# Patient Record
Sex: Female | Born: 1985
Health system: Southern US, Community
[De-identification: ages and names within clinical notes are randomized; demographics above are authoritative.]

## PROBLEM LIST (undated history)

## (undated) DIAGNOSIS — J45909 Unspecified asthma, uncomplicated: Secondary | ICD-10-CM

---

## 2016-08-12 ENCOUNTER — Emergency Department (HOSPITAL_COMMUNITY): Payer: Self-pay

## 2016-08-12 ENCOUNTER — Encounter (HOSPITAL_COMMUNITY): Payer: Self-pay | Admitting: Emergency Medicine

## 2016-08-12 ENCOUNTER — Emergency Department (HOSPITAL_COMMUNITY)
Admission: EM | Admit: 2016-08-12 | Discharge: 2016-08-12 | Disposition: A | Discharge status: Home / Self Care | Payer: Self-pay | Attending: Emergency Medicine | Admitting: Emergency Medicine

## 2016-08-12 DIAGNOSIS — J45909 Unspecified asthma, uncomplicated: Secondary | ICD-10-CM | POA: Insufficient documentation

## 2016-08-12 DIAGNOSIS — K0889 Other specified disorders of teeth and supporting structures: Secondary | ICD-10-CM

## 2016-08-12 DIAGNOSIS — R0789 Other chest pain: Secondary | ICD-10-CM

## 2016-08-12 HISTORY — DX: Unspecified asthma, uncomplicated: J45.909

## 2016-08-12 LAB — BASIC METABOLIC PANEL
Anion gap: 7 (ref 5–15)
BUN: 13 mg/dL (ref 6–20)
CALCIUM: 9.6 mg/dL (ref 8.9–10.3)
CO2: 24 mmol/L (ref 22–32)
CREATININE: 0.58 mg/dL (ref 0.44–1.00)
Chloride: 106 mmol/L (ref 101–111)
GFR calc Af Amer: >60 mL/min (ref >60)
GLUCOSE: 105 mg/dL — AB (ref 65–99)
Potassium: 4.3 mmol/L (ref 3.5–5.1)
SODIUM: 137 mmol/L (ref 135–145)

## 2016-08-12 LAB — CBC
HEMATOCRIT: 38 % (ref 36.0–46.0)
Hemoglobin: 12.9 g/dL (ref 12.0–15.0)
MCH: 27.5 pg (ref 26.0–34.0)
MCHC: 33.9 g/dL (ref 30.0–36.0)
MCV: 81 fL (ref 78.0–100.0)
PLATELETS: 269 10*3/uL (ref 150–400)
RBC: 4.69 MIL/uL (ref 3.87–5.11)
RDW: 13.5 % (ref 11.5–15.5)
WBC: 7.2 10*3/uL (ref 4.0–10.5)

## 2016-08-12 LAB — I-STAT TROPONIN, ED
TROPONIN I, POC: 0 ng/mL (ref 0.00–0.08)
Troponin i, poc: 0 ng/mL (ref 0.00–0.08)

## 2016-08-12 MED ORDER — AMOXICILLIN-POT CLAVULANATE 875-125 MG PO TABS: 1.0000 | ORAL_TABLET | Freq: Two times a day (BID) | ORAL | 0 refills | Status: AC

## 2016-08-12 MED ORDER — IBUPROFEN 800 MG PO TABS: 800.0000 mg | ORAL_TABLET | Freq: Three times a day (TID) | ORAL | 0 refills | Status: DC | PRN

## 2016-08-12 NOTE — ED Provider Notes (Signed)
[facility]-EMERGENCY DEPT Provider Note   CSN: [id number] Arrival date & time: 08/12/16  1352     History   Chief Complaint Chief Complaint  Patient presents with  . Chest Pain  . Dental Pain    HPI Nichole Morris is a 31 y.o. female . Remote history of resolved asthma presenting to the emergency department with 1 day of sharp left upper chest and shoulder discomfort. She states that she has been getting those types of chest pains in the past and has seen a physician but everything was normal. She felt like the pain was sharper and more intense since yesterday and her sister told her to go to the emergency department. She describes the pain as throbbing, waxing and waning with no change in intensity between episodes. She just moved here from [place] and is under a lot of stress. She also reports throbbing dental pain of upper left molars with facial tenderness and puffiness on her left cheek which is worse at night. She states that she does not have insurance at this time he was not able to see a dentist and would like to get resources for dentists in the area. She has tried ibuprofen with modest relief and rinsing her mouth with Peroxide and salt water which has not been helping. 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 She denies any fever, chills, nausea, vomiting, shortness of breath, syncope, or any other symptoms.  HPI  Past Medical History:  Diagnosis Date  . Asthma     There are no active problems to display for this patient.   Past Surgical History:  Procedure Laterality Date  . CESAREAN SECTION      OB History    Gravida Para Term Preterm AB Living   1             SAB TAB Ectopic Multiple Live Births                   Home Medications    Prior to Admission medications   Medication Sig Start Date End Date Taking? Authorizing Provider  ibuprofen (ADVIL,MOTRIN) 200 MG tablet Take 400 mg by mouth every 6 (six) hours as needed for  headache or moderate pain.   Yes Historical Provider, MD  amoxicillin-clavulanate (AUGMENTIN) 875-125 MG tablet Take 1 tablet by mouth 2 (two) times daily. 08/12/16 08/19/16  Georgiana Shore, PA-C  ibuprofen (ADVIL,MOTRIN) 800 MG tablet Take 1 tablet (800 mg total) by mouth every 8 (eight) hours as needed for moderate pain. 08/12/16   Georgiana Shore, PA-C    Family History History reviewed. No pertinent family history.  Social History Social History  Substance Use Topics  . Smoking status: Never Smoker  . Smokeless tobacco: Never Used  . Alcohol use Not on file     Allergies   Shellfish allergy and Iodine   Review of Systems Review of Systems  Constitutional: Negative for appetite change, chills and fever.  HENT: Positive for dental problem and facial swelling. Negative for congestion, drooling, ear pain, sore throat and trouble swallowing.   Eyes: Negative for pain and visual disturbance.  Respiratory: Negative for cough, choking, chest tightness, shortness of breath, wheezing and stridor.   Cardiovascular: Negative for leg swelling.       Patient with left upper chest waxing and waning throbbing pain  Gastrointestinal: Negative for abdominal distention, abdominal pain, diarrhea, nausea and vomiting.  Genitourinary: Negative for dysuria and hematuria.  Musculoskeletal: Negative for arthralgias, back pain, myalgias, neck pain and neck stiffness.       Patient describes some throbbing in her left shoulder  Skin: Negative for color change, pallor, rash and wound.  Neurological: Negative for seizures, syncope, weakness, light-headedness and numbness.  All other systems reviewed and are negative.    Physical Exam Updated Vital Signs BP 119/64 (BP Location: Left Arm)   Pulse 71   Temp 98.4 F (36.9 C) (Oral)   Resp 17   Ht 5\' 1"  (1.549 m)   Wt 86.2 kg   LMP 07/12/2016   SpO2 100%   Breastfeeding? Unknown   BMI 35.90 kg/m   Physical Exam  Constitutional: She appears  well-developed and well-nourished. No distress.  Afebrile, nontoxic appearing, sitting comfortably in bed in no acute distress.  HENT:  Head: Normocephalic and atraumatic.  Mouth/Throat: Oropharynx is clear and moist. No oropharyngeal exudate.  No evidence of oral abscess on exam. Significant decay of the left upper molars. Tooth pain when touched. Oral mucosa is soft and sublingual mucosa is soft without evidence for lugwigs angina  Eyes: Conjunctivae and EOM are normal. Right eye exhibits no discharge. Left eye exhibits no discharge. No scleral icterus.  Neck: Normal range of motion. Neck supple.  Cardiovascular: Normal rate and regular rhythm.   No murmur heard. Pulmonary/Chest: Effort normal and breath sounds normal. No respiratory distress. She has no wheezes. She exhibits tenderness.  Patient was tender to palpation of the left upper chest wall and clavicle. Especially between the second and third intercostal  Abdominal: Soft. She exhibits no distension. There is no tenderness.  Musculoskeletal: Normal range of motion. She exhibits no edema.  She had full active range of motion of left shoulder without pain. No pain to palpation of bony landmarks of shoulder or left arm.  Neurological: She is alert. No sensory deficit.  Skin: Skin is warm and dry. No rash noted. She is not diaphoretic. No erythema. There is pallor.  Psychiatric: She has a normal mood and affect. Her behavior is normal.  Nursing note and vitals reviewed.    ED Treatments / Results  Labs (all labs ordered are listed, but only abnormal results are displayed) Labs Reviewed  BASIC METABOLIC PANEL - Abnormal; Notable for the following:       Result Value   Glucose, Bld 105 (*)    All other components within normal limits  CBC  I-STAT TROPOININ, ED  Rosezena SensorI-STAT TROPOININ, ED  Rosezena SensorI-STAT TROPOININ, ED    EKG  EKG Interpretation  Date/Time:  Wednesday August 12 2016 14:03:51 EST Ventricular Rate:  75 PR Interval:     QRS Duration: 90 QT Interval:  369 QTC Calculation: 413 R Axis:   63 Text Interpretation:  Sinus rhythm Baseline wander in lead(s) III nrmal. no old comparison. Confirmed by Donnald GarrePfeiffer, MD, Lebron ConnersMarcy [phone]([id number]) on 08/12/2016 4:30:56 PM       Radiology Dg Chest 2 View  Result Date: 08/12/2016 CLINICAL DATA:  Chest pain radiating to left arm EXAM: CHEST  2 VIEW COMPARISON:  None. FINDINGS: Lungs are clear. Heart size and pulmonary vascularity are normal. No adenopathy. No pneumothorax. No bone lesions. IMPRESSION: No edema or consolidation. Electronically Signed   By: Bretta BangWilliam  Woodruff III M.D.   On: 08/12/2016 15:02    Procedures Procedures (including critical care time)  Medications Ordered in ED Medications - No data to display   Initial Impression / Assessment and Plan / ED Course  I have reviewed the triage  vital signs and the nursing notes.  Pertinent labs & imaging results that were available during my care of the patient were reviewed by me and considered in my medical decision making (see chart for details).  Clinical Course    Patient is a 31 year old female, afebrile, non-toxic appearing, sitting comfortably in bed. No acute distress. Reassuring exam and workup. Negative troponin, negative chest x-ray and normal EKG. She is currently moving from [place] and under stress. She states that she is feeling well but due to her family history, her sister insisted that she come in and get checked.   16:50 As she was ready to go home she mentioned that her father had an MI at 53 and mother had MI in her 62s. Spoke to Dr Donnald Garre regarding this patient and recommended second troponin. Ordered second troponin.  Second troponin drawn 18:14 negative  HEART score: 1 Her BMI is > 30 and she has a family history of early cardiac disease. Based on patient's presentation, lab workup and risk factors, It is less likely to be of cardiac origin. 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 She is tender to palpation and pain is waxing and waning.   Discharge home with antibiotic, ibuprofen, close follow up with dentist and PCP. Provided patient with resources in the area for dental and PCP. Urged patient to follow-up with dentist.  Exam unconcerning for Ludwig's angina or spread of infection.    Discussed strict return precautions. Patient was advised to return to the emergency department if experiencing any worsening of symptoms, including increased facial; swelling, fever, chills, shortness of breath, chest tightness, nausea, vomiting, weakness or any other concerning symptoms. She understood instructions and agreed with discharge plan. Patient was discussed with Dr. Donnald Garre who agrees with assessment and plan.  Final Clinical Impressions(s) / ED Diagnoses   Final diagnoses:  Pain, dental  Chest wall pain    New Prescriptions New Prescriptions   AMOXICILLIN-CLAVULANATE (AUGMENTIN) 875-125 MG TABLET    Take 1 tablet by mouth 2 (two) times daily.   IBUPROFEN (ADVIL,MOTRIN) 800 MG TABLET    Take 1 tablet (800 mg total) by mouth every 8 (eight) hours as needed for moderate pain.     Georgiana Shore, PA-C 08/12/16 1904    Georgiana Shore, PA-C 08/13/16 0303    Arby Barrette, MD 08/13/16 [phone]

## 2016-08-12 NOTE — ED Notes (Signed)
Per PA, redraw Troponin at 1752.

## 2016-08-12 NOTE — ED Triage Notes (Signed)
Patient reports intermittent sharp left sided chest pain radiating to the left arm. Denies SOB, N/V. Patient also reports left upper molar pain x2 days. Patient states tooth has been cracked for a while.

## 2017-08-05 ENCOUNTER — Encounter (HOSPITAL_COMMUNITY): Payer: Self-pay

## 2017-08-05 ENCOUNTER — Emergency Department (HOSPITAL_COMMUNITY)
Admission: EM | Admit: 2017-08-05 | Discharge: 2017-08-05 | Disposition: A | Discharge status: Home / Self Care | Payer: Self-pay | Attending: Emergency Medicine | Admitting: Emergency Medicine

## 2017-08-05 ENCOUNTER — Other Ambulatory Visit: Payer: Self-pay

## 2017-08-05 ENCOUNTER — Emergency Department (HOSPITAL_COMMUNITY): Payer: Self-pay

## 2017-08-05 DIAGNOSIS — J181 Lobar pneumonia, unspecified organism: Secondary | ICD-10-CM

## 2017-08-05 DIAGNOSIS — J45909 Unspecified asthma, uncomplicated: Secondary | ICD-10-CM | POA: Insufficient documentation

## 2017-08-05 DIAGNOSIS — J189 Pneumonia, unspecified organism: Secondary | ICD-10-CM | POA: Insufficient documentation

## 2017-08-05 LAB — CBC WITH DIFFERENTIAL/PLATELET
BASOS ABS: 0 10*3/uL (ref 0.0–0.1)
BASOS PCT: 0 %
EOS ABS: 0.1 10*3/uL (ref 0.0–0.7)
EOS PCT: 2 %
HCT: 38 % (ref 36.0–46.0)
Hemoglobin: 12.8 g/dL (ref 12.0–15.0)
Lymphocytes Relative: 29 %
Lymphs Abs: 1.7 10*3/uL (ref 0.7–4.0)
MCH: 27.3 pg (ref 26.0–34.0)
MCHC: 33.7 g/dL (ref 30.0–36.0)
MCV: 81 fL (ref 78.0–100.0)
MONO ABS: 0.2 10*3/uL (ref 0.1–1.0)
Monocytes Relative: 4 %
Neutro Abs: 3.8 10*3/uL (ref 1.7–7.7)
Neutrophils Relative %: 65 %
PLATELETS: 234 10*3/uL (ref 150–400)
RBC: 4.69 MIL/uL (ref 3.87–5.11)
RDW: 13.6 % (ref 11.5–15.5)
WBC: 5.9 10*3/uL (ref 4.0–10.5)

## 2017-08-05 LAB — I-STAT BETA HCG BLOOD, ED (MC, WL, AP ONLY)

## 2017-08-05 LAB — COMPREHENSIVE METABOLIC PANEL
ALBUMIN: 3.8 g/dL (ref 3.5–5.0)
ALK PHOS: 119 U/L (ref 38–126)
ALT: 17 U/L (ref 14–54)
AST: 32 U/L (ref 15–41)
Anion gap: 11 (ref 5–15)
BILIRUBIN TOTAL: 0.3 mg/dL (ref 0.3–1.2)
BUN: 9 mg/dL (ref 6–20)
CALCIUM: 9.3 mg/dL (ref 8.9–10.3)
CO2: 20 mmol/L — ABNORMAL LOW (ref 22–32)
CREATININE: 0.75 mg/dL (ref 0.44–1.00)
Chloride: 108 mmol/L (ref 101–111)
GFR calc Af Amer: >60 mL/min (ref >60)
Glucose, Bld: 136 mg/dL — ABNORMAL HIGH (ref 65–99)
POTASSIUM: 3.6 mmol/L (ref 3.5–5.1)
Sodium: 139 mmol/L (ref 135–145)
TOTAL PROTEIN: 7 g/dL (ref 6.5–8.1)

## 2017-08-05 MED ORDER — AZITHROMYCIN 250 MG PO TABS: ORAL_TABLET | ORAL | 0 refills | Status: DC

## 2017-08-05 NOTE — ED Provider Notes (Signed)
[facility]   CSN: [id number] Arrival date & time: 08/05/17  1050     History   Chief Complaint Chief Complaint  Patient presents with  . Asthma  . Shortness of Breath    HPI Nichole Morris is a 31 y.o. female.  She is a been ill for 1 week with cough, shortness of breath, sputum production, and general achiness.  Is using her nebulizer at home without relief.  She is felt warm but not taken her temperature.  She does not smoke cigarettes or take oral contraceptives.  No known sick contacts.  There are no other known modifying factors.  HPI  Past Medical History:  Diagnosis Date  . Asthma     There are no active problems to display for this patient.   Past Surgical History:  Procedure Laterality Date  . CESAREAN SECTION      OB History    Gravida Para Term Preterm AB Living   1             SAB TAB Ectopic Multiple Live Births                   Home Medications    Prior to Admission medications   Medication Sig Start Date End Date Taking? Authorizing Provider  azithromycin (ZITHROMAX Z-PAK) 250 MG tablet 2 po day one, then 1 daily x 4 days 08/05/17   Mancel BaleWentz, Naasir Carreira, MD  ibuprofen (ADVIL,MOTRIN) 200 MG tablet Take 400 mg by mouth every 6 (six) hours as needed for headache or moderate pain.    [provider]  ibuprofen (ADVIL,MOTRIN) 800 MG tablet Take 1 tablet (800 mg total) by mouth every 8 (eight) hours as needed for moderate pain. Patient not taking: Reported on 08/05/2017 08/12/16   Gregary CromerMitchell, Jessica B, PA-C    Family History History reviewed. No pertinent family history.  Social History Social History   Tobacco Use  . Smoking status: Never Smoker  . Smokeless tobacco: Never Used  Substance Use Topics  . Alcohol use: No    Frequency: Never  . Drug use: Not on file     Allergies   Shellfish allergy and Iodine   Review of Systems Review of Systems  All other systems  reviewed and are negative.    Physical Exam Updated Vital Signs BP 111/62   Pulse 70   Temp 98.1 F (36.7 C) (Oral)   Resp (!) 23   LMP 07/31/2017 (Exact Date)   SpO2 98%   Physical Exam  Constitutional: She is oriented to person, place, and time. She appears well-developed and well-nourished. She does not appear ill.  HENT:  Head: Normocephalic and atraumatic.  Eyes: Conjunctivae and EOM are normal. Pupils are equal, round, and reactive to light.  Neck: Normal range of motion and phonation normal. Neck supple.  Cardiovascular: Normal rate and regular rhythm.  Pulmonary/Chest: Effort normal and breath sounds normal. No accessory muscle usage or stridor. Tachypnea noted. No apnea and no bradypnea. No respiratory distress. She has no decreased breath sounds. She has no wheezes. She has no rhonchi. She has no rales. She exhibits no tenderness.  Abdominal: Soft. She exhibits no distension. There is no tenderness. There is no guarding.  Musculoskeletal: Normal range of motion.  Neurological: She is alert and oriented to person, place, and time. She exhibits normal muscle tone.  Skin: Skin is warm and dry.  Psychiatric: She has a normal mood and affect. Her behavior is  normal. Judgment and thought content normal.  Nursing note and vitals reviewed.    ED Treatments / Results  Labs (all labs ordered are listed, but only abnormal results are displayed) Labs Reviewed  COMPREHENSIVE METABOLIC PANEL - Abnormal; Notable for the following components:      Result Value   CO2 20 (*)    Glucose, Bld 136 (*)    All other components within normal limits  CBC WITH DIFFERENTIAL/PLATELET  I-STAT BETA HCG BLOOD, ED ([facility], [facility], [facility] ONLY)    EKG  EKG Interpretation None       Radiology Dg Chest 2 View  Result Date: 08/05/2017 CLINICAL DATA:  Cough over the last 3 weeks, worsening recently. EXAM: CHEST  2 VIEW COMPARISON:  08/12/2016 FINDINGS: Heart size is normal. Mediastinal shadows are  normal. There is a bronchitis pattern. Question mild patchy pneumonia in the right upper lobe. No dense consolidation or lobar collapse. No effusions. IMPRESSION: Bronchitis. Question patchy pneumonia right upper lobe. Electronically Signed   By: Paulina FusiMark  Shogry M.D.   On: 08/05/2017 11:29    Procedures Procedures (including critical care time)  Medications Ordered in ED Medications - No data to display   Initial Impression / Assessment and Plan / ED Course  I have reviewed the triage vital signs and the nursing notes.  Pertinent labs & imaging results that were available during my care of the patient were reviewed by me and considered in my medical decision making (see chart for details).      Patient Vitals for the past 24 hrs:  BP Temp Temp src Pulse Resp SpO2  08/05/17 1200 111/62 - - 70 (!) 23 98 %  08/05/17 1101 122/84 98.1 F (36.7 C) Oral 79 20 100 %    12:59 PM Reevaluation with update and discussion. After initial assessment and treatment, an updated evaluation reveals no change in clinical status.  Findings discussed with the patient and all questions were answered. Mancel BaleElliott Manha Amato     Final Clinical Impressions(s) / ED Diagnoses   Final diagnoses:  Community acquired pneumonia of right upper lobe of lung (HCC)   Evaluations consistent with pneumonia, nonspecified.  Doubt serious bacterial infection, metabolic instability or impending vascular collapse.  Nursing Notes Reviewed/ Care Coordinated Applicable Imaging Reviewed Interpretation of Laboratory Data incorporated into ED treatment  The patient appears reasonably screened and/or stabilized for discharge and I doubt any other medical condition or other [facility] requiring further screening, evaluation, or treatment in the ED at this time prior to discharge.  Plan: Home Medications-continue current medications; Home Treatments-rest, fluids; return here if the recommended treatment, does not improve the symptoms;  Recommended follow up-PCP, PRN   ED Discharge Orders        Ordered    azithromycin (ZITHROMAX Z-PAK) 250 MG tablet     08/05/17 1301       Mancel BaleWentz, Shaquille Janes, MD 08/05/17 1303

## 2017-08-05 NOTE — Discharge Instructions (Signed)
Get plenty of rest, drink a lot of fluids and take the medication as directed.  Use Tylenol, if needed, for fever.  See the doctor of your choice if not better in 3 or 4 days.

## 2017-08-05 NOTE — ED Triage Notes (Signed)
Pt states she has had non productive cough X2 weeks. Pt states she has had increased shortness of breath for 3 days. Pt states using albuterol treatments at home without feeling better. Lung sounds are clear however. Skin warm and dry.

## 2017-08-13 ENCOUNTER — Emergency Department (HOSPITAL_COMMUNITY): Payer: Self-pay

## 2017-08-13 ENCOUNTER — Other Ambulatory Visit: Payer: Self-pay

## 2017-08-13 ENCOUNTER — Emergency Department (HOSPITAL_COMMUNITY)
Admission: EM | Admit: 2017-08-13 | Discharge: 2017-08-13 | Disposition: A | Discharge status: Home / Self Care | Payer: Self-pay | Attending: Emergency Medicine | Admitting: Emergency Medicine

## 2017-08-13 ENCOUNTER — Encounter (HOSPITAL_COMMUNITY): Payer: Self-pay

## 2017-08-13 DIAGNOSIS — R0602 Shortness of breath: Secondary | ICD-10-CM | POA: Insufficient documentation

## 2017-08-13 DIAGNOSIS — J45909 Unspecified asthma, uncomplicated: Secondary | ICD-10-CM | POA: Insufficient documentation

## 2017-08-13 LAB — COMPREHENSIVE METABOLIC PANEL
ALK PHOS: 120 U/L (ref 38–126)
ALT: 22 U/L (ref 14–54)
AST: 23 U/L (ref 15–41)
Albumin: 4.1 g/dL (ref 3.5–5.0)
Anion gap: 7 (ref 5–15)
BUN: 15 mg/dL (ref 6–20)
CALCIUM: 9.6 mg/dL (ref 8.9–10.3)
CO2: 23 mmol/L (ref 22–32)
Chloride: 105 mmol/L (ref 101–111)
Creatinine, Ser: 0.6 mg/dL (ref 0.44–1.00)
Glucose, Bld: 101 mg/dL — ABNORMAL HIGH (ref 65–99)
Potassium: 3.9 mmol/L (ref 3.5–5.1)
SODIUM: 135 mmol/L (ref 135–145)
Total Bilirubin: 0.7 mg/dL (ref 0.3–1.2)
Total Protein: 7.5 g/dL (ref 6.5–8.1)

## 2017-08-13 LAB — I-STAT BETA HCG BLOOD, ED (MC, WL, AP ONLY)

## 2017-08-13 LAB — CBC WITH DIFFERENTIAL/PLATELET
BASOS PCT: 0 %
Basophils Absolute: 0 10*3/uL (ref 0.0–0.1)
EOS ABS: 0.2 10*3/uL (ref 0.0–0.7)
EOS PCT: 2 %
HCT: 38.7 % (ref 36.0–46.0)
HEMOGLOBIN: 12.8 g/dL (ref 12.0–15.0)
LYMPHS ABS: 1.8 10*3/uL (ref 0.7–4.0)
Lymphocytes Relative: 23 %
MCH: 27.2 pg (ref 26.0–34.0)
MCHC: 33.1 g/dL (ref 30.0–36.0)
MCV: 82.2 fL (ref 78.0–100.0)
MONOS PCT: 6 %
Monocytes Absolute: 0.5 10*3/uL (ref 0.1–1.0)
NEUTROS PCT: 69 %
Neutro Abs: 5.4 10*3/uL (ref 1.7–7.7)
PLATELETS: 279 10*3/uL (ref 150–400)
RBC: 4.71 MIL/uL (ref 3.87–5.11)
RDW: 14 % (ref 11.5–15.5)
WBC: 7.9 10*3/uL (ref 4.0–10.5)

## 2017-08-13 LAB — I-STAT CG4 LACTIC ACID, ED
Lactic Acid, Venous: 0.84 mmol/L (ref 0.5–1.9)
Lactic Acid, Venous: 1.07 mmol/L (ref 0.5–1.9)

## 2017-08-13 LAB — TROPONIN I: Troponin I: <0.03 ng/mL (ref <0.03)

## 2017-08-13 MED ORDER — IOPAMIDOL (ISOVUE-370) INJECTION 76%
INTRAVENOUS | Status: AC
  Administered 2017-08-13: 100 mL
  Filled 2017-08-13: qty 100

## 2017-08-13 NOTE — ED Triage Notes (Signed)
Pt states she was seen here last week and dx with pna. She states she still has shortness of breath and has completed her abx. No distress noted. Skin warm and dry.

## 2017-08-13 NOTE — ED Provider Notes (Signed)
[facility]   CSN: [id number] Arrival date & time: 08/13/17  1147     History   Chief Complaint Chief Complaint  Patient presents with  . Pneumonia    HPI Nichole Morris is a 32 y.o. female.  Pt presents to the ED today with sob.  The pt said she was initially seen in the ED on 12/27.  She was diagnosed with pna and put on zithromax.  Pt said she's finished her abx, but is still sob.  She used her neb machine this morning and that did not help.  She is breathing better now.  No fevers.      Past Medical History:  Diagnosis Date  . Asthma     There are no active problems to display for this patient.   Past Surgical History:  Procedure Laterality Date  . CESAREAN SECTION      OB History    Gravida Para Term Preterm AB Living   1             SAB TAB Ectopic Multiple Live Births                   Home Medications    Prior to Admission medications   Medication Sig Start Date End Date Taking? Authorizing Provider  azithromycin (ZITHROMAX Z-PAK) 250 MG tablet 2 po day one, then 1 daily x 4 days 08/05/17   Mancel BaleWentz, Elliott, MD  ibuprofen (ADVIL,MOTRIN) 200 MG tablet Take 400 mg by mouth every 6 (six) hours as needed for headache or moderate pain.    [provider]  ibuprofen (ADVIL,MOTRIN) 800 MG tablet Take 1 tablet (800 mg total) by mouth every 8 (eight) hours as needed for moderate pain. Patient not taking: Reported on 08/05/2017 08/12/16   Gregary CromerMitchell, Jessica B, PA-C    Family History History reviewed. No pertinent family history.  Social History Social History   Tobacco Use  . Smoking status: Never Smoker  . Smokeless tobacco: Never Used  Substance Use Topics  . Alcohol use: No    Frequency: Never  . Drug use: Not on file     Allergies   Shellfish allergy and Iodine   Review of Systems Review of Systems  Respiratory: Positive for shortness of breath.   All other systems reviewed  and are negative.    Physical Exam Updated Vital Signs BP (!) 102/58 (BP Location: Right Arm)   Pulse 71   Temp 98.3 F (36.8 C) (Oral)   Resp 18   LMP 07/31/2017 (Exact Date)   SpO2 100%   Physical Exam  Constitutional: She is oriented to person, place, and time. She appears well-developed and well-nourished.  HENT:  Head: Normocephalic and atraumatic.  Right Ear: External ear normal.  Left Ear: External ear normal.  Nose: Nose normal.  Mouth/Throat: Oropharynx is clear and moist.  Eyes: Conjunctivae and EOM are normal. Pupils are equal, round, and reactive to light.  Neck: Normal range of motion. Neck supple.  Cardiovascular: Normal rate, regular rhythm, normal heart sounds and intact distal pulses.  Pulmonary/Chest: Effort normal and breath sounds normal.  Abdominal: Soft. Bowel sounds are normal.  Musculoskeletal: Normal range of motion.  Neurological: She is alert and oriented to person, place, and time.  Skin: Skin is warm and dry. Capillary refill takes less than 2 seconds.  Psychiatric: She has a normal mood and affect. Her behavior is normal. Judgment and thought content normal.  Nursing note and  vitals reviewed.    ED Treatments / Results  Labs (all labs ordered are listed, but only abnormal results are displayed) Labs Reviewed  COMPREHENSIVE METABOLIC PANEL - Abnormal; Notable for the following components:      Result Value   Glucose, Bld 101 (*)    All other components within normal limits  CBC WITH DIFFERENTIAL/PLATELET  TROPONIN I  I-STAT CG4 LACTIC ACID, ED  I-STAT BETA HCG BLOOD, ED ([facility], [facility], [facility] ONLY)  I-STAT CG4 LACTIC ACID, ED    EKG  EKG Interpretation  Date/Time:  Friday August 13 2017 12:54:01 EST Ventricular Rate:  84 PR Interval:  138 QRS Duration: 80 QT Interval:  358 QTC Calculation: 423 R Axis:   90 Text Interpretation:  Normal sinus rhythm Rightward axis Borderline ECG No significant change since last tracing Confirmed by  Jacalyn Lefevre [phone]) on 08/13/2017 1:42:23 PM       Radiology Dg Chest 2 View  Result Date: 08/13/2017 CLINICAL DATA:  Worsening cough and shortness of breath. EXAM: CHEST  2 VIEW COMPARISON:  08/05/2017 FINDINGS: Cardiomediastinal silhouette is normal. Mediastinal contours appear intact. There is no evidence of focal airspace consolidation, pleural effusion or pneumothorax. Osseous structures are without acute abnormality. Soft tissues are grossly normal. IMPRESSION: No active cardiopulmonary disease. Electronically Signed   By: Ted Mcalpine M.D.   On: 08/13/2017 13:15    Procedures Procedures (including critical care time)  Medications Ordered in ED Medications  iopamidol (ISOVUE-370) 76 % injection (100 mLs  Contrast Given 08/13/17 1552)     Initial Impression / Assessment and Plan / ED Course  I have reviewed the triage vital signs and the nursing notes.  Pertinent labs & imaging results that were available during my care of the patient were reviewed by me and considered in my medical decision making (see chart for details).    Pt signed out to Dr. Effie Shy pending results of CT angio.  Disposition pending results.  Final Clinical Impressions(s) / ED Diagnoses   Final diagnoses:  Shortness of breath    ED Discharge Orders    None       Jacalyn Lefevre, MD 08/13/17 [phone]

## 2017-08-13 NOTE — ED Notes (Signed)
Attempted PIVx 3; no success. Second RN to attempt. 

## 2017-08-13 NOTE — ED Notes (Signed)
Patient transported to CT 

## 2017-08-13 NOTE — Discharge Instructions (Signed)
Follow-up with the doctor of your choice as needed for further problems and treatment.  For nasal congestion, try using nasal saline spray, to moisturize the nasal passages.  For pain, use Tylenol.  For trouble breathing use your albuterol nebulizer.

## 2017-08-13 NOTE — ED Provider Notes (Signed)
Patient seen in follow-up for Dr. Particia NearingHaviland, after CT imaging to evaluate for pulmonary embolus.  She has completed her Zithromax prescribed by me recently for pneumonia.  At this time the patient complains of some nasal congestion, which led to her trouble breathing today.  I advised her to use nasal saline as needed, and follow-up with a primary care doctor of her choice.  She stated that she had a list to go on to find a doctor.  Medical decision making: Nonspecific shortness of breath.  CT does not indicate PE or pneumonia.  Patient is nontoxic, with reassuring evaluation here today.   Patient is recovering from pneumonia, no further treatment needed at this time.  She has a nebulizer at home to use as needed for shortness of breath.   Mancel BaleWentz, Jaquitta Dupriest, MD 08/13/17 [phone]

## 2018-01-18 ENCOUNTER — Ambulatory Visit (HOSPITAL_COMMUNITY)
Admission: EM | Admit: 2018-01-18 | Discharge: 2018-01-18 | Disposition: A | Discharge status: Home / Self Care | Payer: Self-pay

## 2018-01-18 NOTE — ED Notes (Signed)
Left from lobby

## 2018-08-30 ENCOUNTER — Ambulatory Visit (HOSPITAL_COMMUNITY)
Admission: EM | Admit: 2018-08-30 | Discharge: 2018-08-30 | Disposition: A | Discharge status: Home / Self Care | Payer: Self-pay | Attending: Family Medicine | Admitting: Family Medicine

## 2018-08-30 ENCOUNTER — Encounter (HOSPITAL_COMMUNITY): Payer: Self-pay

## 2018-08-30 DIAGNOSIS — H9201 Otalgia, right ear: Secondary | ICD-10-CM

## 2018-08-30 DIAGNOSIS — J029 Acute pharyngitis, unspecified: Secondary | ICD-10-CM | POA: Insufficient documentation

## 2018-08-30 LAB — POCT RAPID STREP A: STREPTOCOCCUS, GROUP A SCREEN (DIRECT): NEGATIVE

## 2018-08-30 MED ORDER — IBUPROFEN 600 MG PO TABS: 600.0000 mg | ORAL_TABLET | Freq: Four times a day (QID) | ORAL | 0 refills | Status: DC | PRN

## 2018-08-30 MED ORDER — CETIRIZINE HCL 10 MG PO CAPS: 10.0000 mg | ORAL_CAPSULE | Freq: Every day | ORAL | 0 refills | Status: DC

## 2018-08-30 NOTE — ED Triage Notes (Signed)
Pt presents with sore throat. 

## 2018-08-30 NOTE — Discharge Instructions (Signed)

## 2018-08-30 NOTE — ED Provider Notes (Signed)
[facility]    CSN: [id number] Arrival date & time: 08/30/18  [id number]     History   Chief Complaint Chief Complaint  Patient presents with  . Sore Throat    HPI Nichole Morris is a 33 y.o. female history of asthma presenting today for evaluation of a sore throat.  Sore throat began last night.  She has had difficulty swallowing and pain mainly on the right side.  Pain is been radiating to her right ear.  She has tried ibuprofen with mild relief.  She also notes that yesterday she had some dental work done and had moldings taking of her teeth.  She denies associated URI symptoms of nasal congestion, cough.  Denies fevers.  HPI  Past Medical History:  Diagnosis Date  . Asthma     There are no active problems to display for this patient.   Past Surgical History:  Procedure Laterality Date  . CESAREAN SECTION      OB History    Gravida  1   Para      Term      Preterm      AB      Living        SAB      TAB      Ectopic      Multiple      Live Births               Home Medications    Prior to Admission medications   Medication Sig Start Date End Date Taking? Authorizing Provider  Cetirizine HCl 10 MG CAPS Take 1 capsule (10 mg total) by mouth daily for 10 days. 08/30/18 09/09/18  Wieters, Hallie C, PA-C  ibuprofen (ADVIL,MOTRIN) 600 MG tablet Take 1 tablet (600 mg total) by mouth every 6 (six) hours as needed. 08/30/18   Wieters, Junius CreamerHallie C, PA-C    Family History History reviewed. No pertinent family history.  Social History Social History   Tobacco Use  . Smoking status: Never Smoker  . Smokeless tobacco: Never Used  Substance Use Topics  . Alcohol use: No    Frequency: Never  . Drug use: Not on file     Allergies   Shellfish allergy and Iodine   Review of Systems Review of Systems  Constitutional: Negative for activity change, appetite change, chills, fatigue and fever.  HENT: Positive for sore throat. Negative for  congestion, ear pain, rhinorrhea, sinus pressure and trouble swallowing.   Eyes: Negative for discharge and redness.  Respiratory: Negative for cough, chest tightness and shortness of breath.   Cardiovascular: Negative for chest pain.  Gastrointestinal: Negative for abdominal pain, diarrhea, nausea and vomiting.  Musculoskeletal: Negative for myalgias.  Skin: Negative for rash.  Neurological: Negative for dizziness, light-headedness and headaches.     Physical Exam Triage Vital Signs ED Triage Vitals  Enc Vitals Group     BP 08/30/18 0917 116/78     Pulse Rate 08/30/18 0917 96     Resp 08/30/18 0917 16     Temp 08/30/18 0917 98.5 F (36.9 C)     Temp Source 08/30/18 0917 Oral     SpO2 08/30/18 0917 98 %     Weight --      Height --      Head Circumference --      Peak Flow --      Pain Score 08/30/18 0923 9     Pain Loc --      Pain  Edu? --      Excl. in GC? --    No data found.  Updated Vital Signs BP 116/78 (BP Location: Right Arm)   Pulse 96   Temp 98.5 F (36.9 C) (Oral)   Resp 16   LMP 08/10/2018   SpO2 98%   Visual Acuity Right Eye Distance:   Left Eye Distance:   Bilateral Distance:    Right Eye Near:   Left Eye Near:    Bilateral Near:     Physical Exam Vitals signs and nursing note reviewed.  Constitutional:      General: She is not in acute distress.    Appearance: She is well-developed.  HENT:     Head: Normocephalic and atraumatic.     Ears:     Comments: Bilateral ears without tenderness to palpation of external auricle, tragus and mastoid, EAC's without erythema or swelling, TM's with good bony landmarks and cone of light. Non erythematous.    Nose:     Comments: Nasal mucosa pink, slightly swollen turbinates    Mouth/Throat:     Comments: Oral mucosa pink and moist, no tonsillar enlargement, right tonsil with slight erythema to anterior aspect of tonsil, no exudate, left without erythema or exudate. Posterior pharynx patent and  nonerythematous, no uvula deviation or swelling. Normal phonation. Eyes:     Conjunctiva/sclera: Conjunctivae normal.  Neck:     Musculoskeletal: Neck supple.  Cardiovascular:     Rate and Rhythm: Normal rate and regular rhythm.     Heart sounds: No murmur.  Pulmonary:     Effort: Pulmonary effort is normal. No respiratory distress.     Breath sounds: Normal breath sounds.     Comments: Breathing comfortably at rest, CTABL, no wheezing, rales or other adventitious sounds auscultated Abdominal:     Palpations: Abdomen is soft.     Tenderness: There is no abdominal tenderness.  Skin:    General: Skin is warm and dry.  Neurological:     Mental Status: She is alert.      UC Treatments / Results  Labs (all labs ordered are listed, but only abnormal results are displayed) Labs Reviewed  CULTURE, GROUP A STREP [facility])  POCT RAPID STREP A    EKG None  Radiology No results found.  Procedures Procedures (including critical care time)  Medications Ordered in UC Medications - No data to display  Initial Impression / Assessment and Plan / UC Course  I have reviewed the triage vital signs and the nursing notes.  Pertinent labs & imaging results that were available during my care of the patient were reviewed by me and considered in my medical decision making (see chart for details).     Strep test negative.  Patient does appear to have some erythema and irritation to anterior aspect of right tonsil, but does have any swelling and does not appear to have tonsillitis at this time.  Will recommend symptomatic supportive management of the sore throat for likely viral illness.  Recommendations below.  Continue to monitor,Discussed strict return precautions. Patient verbalized understanding and is agreeable with plan.  Final Clinical Impressions(s) / UC Diagnoses   Final diagnoses:  Sore throat     Discharge Instructions     Sore Throat  Your rapid strep tested Negative today.  We will send for a culture and call in about 2 days if results are positive. For now we will treat your sore throat as a virus with symptom management.   Please continue  Tylenol or Ibuprofen for fever and pain. May try salt water gargles, cepacol lozenges, throat spray, or OTC cold relief medicine for throat discomfort. If you also have congestion take a daily anti-histamine like Zyrtec, Claritin, and a oral decongestant to help with post nasal drip that may be irritating your throat.   Stay hydrated and drink plenty of fluids to keep your throat coated relieve irritation.     ED Prescriptions    Medication Sig Dispense Auth. Provider   Cetirizine HCl 10 MG CAPS Take 1 capsule (10 mg total) by mouth daily for 10 days. 10 capsule Wieters, Hallie C, PA-C   ibuprofen (ADVIL,MOTRIN) 600 MG tablet Take 1 tablet (600 mg total) by mouth every 6 (six) hours as needed. 30 tablet Wieters, Wellington C, PA-C     Controlled Substance Prescriptions [place] Controlled Substance Registry consulted? Not Applicable   Lew Dawes, [place] 08/30/18 [phone]

## 2018-08-31 ENCOUNTER — Encounter (HOSPITAL_COMMUNITY): Payer: Self-pay | Admitting: Emergency Medicine

## 2018-08-31 ENCOUNTER — Ambulatory Visit (HOSPITAL_COMMUNITY)
Admission: EM | Admit: 2018-08-31 | Discharge: 2018-08-31 | Disposition: A | Discharge status: Home / Self Care | Payer: Self-pay | Attending: Family Medicine | Admitting: Family Medicine

## 2018-08-31 DIAGNOSIS — J039 Acute tonsillitis, unspecified: Secondary | ICD-10-CM | POA: Insufficient documentation

## 2018-08-31 MED ORDER — CEFDINIR 300 MG PO CAPS: 600.0000 mg | ORAL_CAPSULE | Freq: Every day | ORAL | 0 refills | Status: DC

## 2018-08-31 MED ORDER — ACETAMINOPHEN 160 MG/5ML PO SOLN
650.0000 mg | Freq: Once | ORAL | Status: AC
  Administered 2018-08-31: 650 mg via ORAL

## 2018-08-31 MED ORDER — CEFTRIAXONE SODIUM 1 G IJ SOLR
INTRAMUSCULAR | Status: AC
  Filled 2018-08-31: qty 10

## 2018-08-31 MED ORDER — CEFTRIAXONE SODIUM 1 G IJ SOLR
1.0000 g | Freq: Once | INTRAMUSCULAR | Status: AC
  Administered 2018-08-31: 1 g via INTRAMUSCULAR

## 2018-08-31 MED ORDER — MAGIC MOUTHWASH W/LIDOCAINE: 10.0000 mL | ORAL | 0 refills | Status: DC | PRN

## 2018-08-31 MED ORDER — ACETAMINOPHEN 160 MG/5ML PO SOLN
ORAL | Status: AC
  Filled 2018-08-31: qty 20.3

## 2018-08-31 NOTE — ED Triage Notes (Signed)
Pt here with sore throat and ear pain; pt sts body aches; pt seen here yesterday for same

## 2018-08-31 NOTE — ED Provider Notes (Signed)
[facility]    CSN: [id number] Arrival date & time: 08/31/18  1103     History   Chief Complaint Chief Complaint  Patient presents with  . Sore Throat    HPI Nichole Morris is a 33 y.o. female.   Pt here with sore throat and ear pain; pt sts body aches; pt seen here yesterday for same.  Patient works as a Engineer, structuralcaregiver.  She says her throat is worse today and she is developed a new fever.  She reports not being able to eat or drink for 2 days.     Past Medical History:  Diagnosis Date  . Asthma     There are no active problems to display for this patient.   Past Surgical History:  Procedure Laterality Date  . CESAREAN SECTION      OB History    Gravida  1   Para      Term      Preterm      AB      Living        SAB      TAB      Ectopic      Multiple      Live Births               Home Medications    Prior to Admission medications   Medication Sig Start Date End Date Taking? Authorizing Provider  cefdinir (OMNICEF) 300 MG capsule Take 2 capsules (600 mg total) by mouth daily. 08/31/18   Elvina SidleLauenstein, Natalyn Szymanowski, MD  Cetirizine HCl 10 MG CAPS Take 1 capsule (10 mg total) by mouth daily for 10 days. 08/30/18 09/09/18  Wieters, Hallie C, PA-C  ibuprofen (ADVIL,MOTRIN) 600 MG tablet Take 1 tablet (600 mg total) by mouth every 6 (six) hours as needed. 08/30/18   Wieters, Hallie C, PA-C  magic mouthwash w/lidocaine SOLN Take 10 mLs by mouth every 2 (two) hours as needed for mouth pain. 08/31/18   Elvina SidleLauenstein, Farrel Guimond, MD    Family History History reviewed. No pertinent family history.  Social History Social History   Tobacco Use  . Smoking status: Never Smoker  . Smokeless tobacco: Never Used  Substance Use Topics  . Alcohol use: No    Frequency: Never  . Drug use: Not on file     Allergies   Shellfish allergy and Iodine   Review of Systems Review of Systems   Physical Exam Triage Vital Signs ED Triage Vitals [08/31/18 1155]    Enc Vitals Group     BP 111/69     Pulse Rate (!) 116     Resp 18     Temp 99.7 F (37.6 C)     Temp Source Temporal     SpO2 100 %     Weight      Height      Head Circumference      Peak Flow      Pain Score 9     Pain Loc      Pain Edu?      Excl. in GC?    No data found.  Updated Vital Signs BP 111/69 (BP Location: Left Arm)   Pulse (!) 116   Temp 99.7 F (37.6 C) (Temporal)   Resp 18   LMP 08/10/2018   SpO2 100%    Physical Exam Vitals signs and nursing note reviewed.  HENT:     Head: Normocephalic.     Right Ear: Tympanic membrane normal.  Left Ear: Tympanic membrane normal.     Mouth/Throat:     Mouth: Mucous membranes are moist.     Pharynx: Uvula midline. Pharyngeal swelling and posterior oropharyngeal erythema present.     Tonsils: Swelling: 3+ on the right. 1+ on the left.  Eyes:     Conjunctiva/sclera: Conjunctivae normal.  Neck:     Musculoskeletal: Normal range of motion and neck supple.     Comments: Very tender right neck with fullness suggestive of adenopathy Pulmonary:     Effort: Pulmonary effort is normal.  Lymphadenopathy:     Cervical: Cervical adenopathy present.  Skin:    General: Skin is warm and dry.  Neurological:     General: No focal deficit present.      UC Treatments / Results  Labs (all labs ordered are listed, but only abnormal results are displayed) Labs Reviewed - No data to display  EKG None  Radiology No results found.  Procedures Procedures (including critical care time)  Medications Ordered in UC Medications  cefTRIAXone (ROCEPHIN) injection 1 g (has no administration in time range)  acetaminophen (TYLENOL) solution 650 mg (650 mg Oral Given by Other 08/31/18 1207)    Initial Impression / Assessment and Plan / UC Course  I have reviewed the triage vital signs and the nursing notes.  Pertinent labs & imaging results that were available during my care of the patient were reviewed by me and  considered in my medical decision making (see chart for details).    Final Clinical Impressions(s) / UC Diagnoses   Final diagnoses:  Tonsillitis     Discharge Instructions     If throat pain becomes too severe, go to the emergency department    ED Prescriptions    Medication Sig Dispense Auth. Provider   magic mouthwash w/lidocaine SOLN Take 10 mLs by mouth every 2 (two) hours as needed for mouth pain. 360 mL Elvina SidleLauenstein, Hollyann Pablo, MD   cefdinir (OMNICEF) 300 MG capsule Take 2 capsules (600 mg total) by mouth daily. 20 capsule Elvina SidleLauenstein, Yogesh Cominsky, MD     Controlled Substance Prescriptions [place] Controlled Substance Registry consulted? Not Applicable   Elvina SidleLauenstein, Noor Vidales, MD 08/31/18 1252

## 2018-08-31 NOTE — Discharge Instructions (Addendum)
If throat pain becomes too severe, go to the emergency department

## 2018-09-01 LAB — CULTURE, GROUP A STREP (THRC)

## 2018-10-06 ENCOUNTER — Ambulatory Visit (HOSPITAL_COMMUNITY)
Admission: EM | Admit: 2018-10-06 | Discharge: 2018-10-06 | Disposition: A | Discharge status: Home / Self Care | Payer: Self-pay | Attending: Family Medicine | Admitting: Family Medicine

## 2018-10-06 ENCOUNTER — Encounter (HOSPITAL_COMMUNITY): Payer: Self-pay | Admitting: Emergency Medicine

## 2018-10-06 DIAGNOSIS — J029 Acute pharyngitis, unspecified: Secondary | ICD-10-CM | POA: Insufficient documentation

## 2018-10-06 LAB — POCT RAPID STREP A: STREPTOCOCCUS, GROUP A SCREEN (DIRECT): NEGATIVE

## 2018-10-06 NOTE — Discharge Instructions (Addendum)
Your strep test was negative The last test that was performed here was also negative along with the culture.  I believe this is viral which can cause the same symptoms as strep throat.  You can use the throat wash that was prescribed last time for the pain.  For these frequent recurrences I would recommend follow up with ear, nose and throat specialist for further evaluation and management.  Follow up as needed for continued or worsening symptoms

## 2018-10-06 NOTE — ED Triage Notes (Signed)
PT C/O: cold sx onset 3 days associated w/sore throat, chills, fevers, dry cough, left ear pain  Reports she was dx w/tonisillitis 3 weeks ago and was given PO cefdinir here at the [facility]  DENIES: v/n/d  TAKING MEDS: OTC cold meds  A&O x4... NAD... Ambulatory

## 2018-10-07 NOTE — ED Provider Notes (Addendum)
[facility]    CSN: [id number] Arrival date & time: 10/06/18  0820     History   Chief Complaint Chief Complaint  Patient presents with  . URI    HPI Nichole Morris is a 33 y.o. female.   Patient is a 33 year old female who presents with 3 days of sore throat, chills, fever, dry cough ,and left ear pain.  Her symptoms have been constant and remain the same.  She has been using over-the-counter medications without much relief of  her symptoms.  She does have a past medical history of asthma.  She reports that 3 weeks ago she was given antibiotics to treat tonsillitis here at urgent care.  Looking back at lab results her rapid strep test was negative and her culture was negative.  She denies any fevers, myalgias.  Denies any recent sick contacts.  Denies any recent traveling.  ROS per HPI      Past Medical History:  Diagnosis Date  . Asthma     There are no active problems to display for this patient.   Past Surgical History:  Procedure Laterality Date  . CESAREAN SECTION      OB History    Gravida  1   Para      Term      Preterm      AB      Living        SAB      TAB      Ectopic      Multiple      Live Births               Home Medications    Prior to Admission medications   Medication Sig Start Date End Date Taking? Authorizing Provider  Cetirizine HCl 10 MG CAPS Take 1 capsule (10 mg total) by mouth daily for 10 days. 08/30/18 09/09/18  Wieters, Hallie C, PA-C  ibuprofen (ADVIL,MOTRIN) 600 MG tablet Take 1 tablet (600 mg total) by mouth every 6 (six) hours as needed. 08/30/18   Wieters, Hallie C, PA-C  magic mouthwash w/lidocaine SOLN Take 10 mLs by mouth every 2 (two) hours as needed for mouth pain. 08/31/18   Elvina SidleLauenstein, Kurt, MD    Family History History reviewed. No pertinent family history.  Social History Social History   Tobacco Use  . Smoking status: Never Smoker  . Smokeless tobacco: Never Used  Substance  Use Topics  . Alcohol use: No    Frequency: Never  . Drug use: Not on file     Allergies   Shellfish allergy and Iodine   Review of Systems Review of Systems   Physical Exam Triage Vital Signs ED Triage Vitals  Enc Vitals Group     BP 10/06/18 0900 111/74     Pulse Rate 10/06/18 0900 78     Resp 10/06/18 0900 20     Temp 10/06/18 0900 98.4 F (36.9 C)     Temp Source 10/06/18 0900 Oral     SpO2 10/06/18 0900 99 %     Weight --      Height --      Head Circumference --      Peak Flow --      Pain Score 10/06/18 0902 4     Pain Loc --      Pain Edu? --      Excl. in GC? --    No data found.  Updated Vital Signs BP 111/74 (BP Location:  Left Arm)   Pulse 78   Temp 98.4 F (36.9 C) (Oral)   Resp 20   LMP 09/30/2018   SpO2 99%   Visual Acuity Right Eye Distance:   Left Eye Distance:   Bilateral Distance:    Right Eye Near:   Left Eye Near:    Bilateral Near:     Physical Exam Vitals signs and nursing note reviewed.  Constitutional:      General: She is not in acute distress.    Appearance: Normal appearance. She is well-developed. She is not ill-appearing, toxic-appearing or diaphoretic.  HENT:     Head: Normocephalic and atraumatic.     Right Ear: Tympanic membrane and ear canal normal.     Left Ear: Tympanic membrane and ear canal normal.     Nose: Nose normal.     Mouth/Throat:     Pharynx: Posterior oropharyngeal erythema present.     Tonsils: Tonsillar exudate present. Swelling: 1+ on the right. 1+ on the left.  Eyes:     Conjunctiva/sclera: Conjunctivae normal.  Neck:     Musculoskeletal: Normal range of motion and neck supple. No neck rigidity or muscular tenderness.     Vascular: No carotid bruit.  Cardiovascular:     Rate and Rhythm: Normal rate and regular rhythm.     Heart sounds: No murmur.  Pulmonary:     Effort: Pulmonary effort is normal. No respiratory distress.     Breath sounds: Normal breath sounds.  Musculoskeletal: Normal  range of motion.  Lymphadenopathy:     Cervical: No cervical adenopathy.  Skin:    General: Skin is warm and dry.  Neurological:     Mental Status: She is alert.  Psychiatric:        Mood and Affect: Mood normal.      UC Treatments / Results  Labs (all labs ordered are listed, but only abnormal results are displayed) Labs Reviewed  CULTURE, GROUP A STREP [facility])  POCT RAPID STREP A    EKG None  Radiology No results found.  Procedures Procedures (including critical care time)  Medications Ordered in UC Medications - No data to display  Initial Impression / Assessment and Plan / UC Course  I have reviewed the triage vital signs and the nursing notes.  Pertinent labs & imaging results that were available during my care of the patient were reviewed by me and considered in my medical decision making (see chart for details).     Sore throat Rapid strep test here negative Symptoms most likely viral Recommended follow-up with ear nose and throat specialist for recurrence of tonsillitis. She can use the Magic mouthwash that she was prescribed recently to help with the discomfort in throat. Follow up as needed for continued or worsening symptoms  Final Clinical Impressions(s) / UC Diagnoses   Final diagnoses:  Sore throat     Discharge Instructions     Your strep test was negative The last test that was performed here was also negative along with the culture.  I believe this is viral which can cause the same symptoms as strep throat.  You can use the throat wash that was prescribed last time for the pain.  For these frequent recurrences I would recommend follow up with ear, nose and throat specialist for further evaluation and management.  Follow up as needed for continued or worsening symptoms     ED Prescriptions    None     Controlled Substance Prescriptions [place] Controlled Substance  Registry consulted? Not Applicable   Janace Aris, NP 10/07/18  0910    Janace Aris, NP 10/07/18 [phone]

## 2018-10-08 LAB — CULTURE, GROUP A STREP (THRC)

## 2018-10-10 ENCOUNTER — Telehealth (HOSPITAL_COMMUNITY): Payer: Self-pay | Admitting: Emergency Medicine

## 2018-10-10 NOTE — Telephone Encounter (Signed)
Culture is positive for non group A Strep germ.  This is a finding of uncertain significance; not the typical 'strep throat' germ. Called patient and she states shes getting better.

## 2018-10-14 ENCOUNTER — Ambulatory Visit (HOSPITAL_COMMUNITY)
Admission: EM | Admit: 2018-10-14 | Discharge: 2018-10-14 | Disposition: A | Discharge status: Home / Self Care | Payer: Self-pay | Attending: Family Medicine | Admitting: Family Medicine

## 2018-10-14 ENCOUNTER — Encounter (HOSPITAL_COMMUNITY): Payer: Self-pay | Admitting: Emergency Medicine

## 2018-10-14 DIAGNOSIS — J020 Streptococcal pharyngitis: Secondary | ICD-10-CM

## 2018-10-14 MED ORDER — AMOXICILLIN 875 MG PO TABS: 875.0000 mg | ORAL_TABLET | Freq: Two times a day (BID) | ORAL | 0 refills | Status: AC

## 2018-10-14 NOTE — ED Provider Notes (Signed)
[facility]    CSN: [id number] Arrival date & time: 10/14/18  0820     History   Chief Complaint Chief Complaint  Patient presents with  . Sore Throat    HPI Nichole Morris is a 33 y.o. female.   HPI  Patient was seen 9 days ago for sore throat.  Her rapid strep was negative but the culture grew non-group A strep.  She was not treated with antibiotics.  Her sore throat has persisted.  She felt like it was getting better for a couple of days but now is very painful again.  She states she is prone to "tonsillitis".  She does have some underlying allergies that she thinks may be contributing.  She has been taking her cetirizine, pushing fluids, and she got a humidifier.  No fever.  No runny nose or cough.  Past Medical History:  Diagnosis Date  . Asthma     There are no active problems to display for this patient.   Past Surgical History:  Procedure Laterality Date  . CESAREAN SECTION      OB History    Gravida  1   Para      Term      Preterm      AB      Living        SAB      TAB      Ectopic      Multiple      Live Births               Home Medications    Prior to Admission medications   Medication Sig Start Date End Date Taking? Authorizing Provider  amoxicillin (AMOXIL) 875 MG tablet Take 1 tablet (875 mg total) by mouth 2 (two) times daily for 10 days. 10/14/18 10/24/18  Eustace Moore, MD  Cetirizine HCl 10 MG CAPS Take 1 capsule (10 mg total) by mouth daily for 10 days. 08/30/18 09/09/18  Wieters, Hallie C, PA-C  ibuprofen (ADVIL,MOTRIN) 600 MG tablet Take 1 tablet (600 mg total) by mouth every 6 (six) hours as needed. 08/30/18   Wieters, Hallie C, PA-C  magic mouthwash w/lidocaine SOLN Take 10 mLs by mouth every 2 (two) hours as needed for mouth pain. 08/31/18   Elvina Sidle, MD    Family History No family history on file.  Social History Social History   Tobacco Use  . Smoking status: Never Smoker  . Smokeless  tobacco: Never Used  Substance Use Topics  . Alcohol use: No    Frequency: Never  . Drug use: Not on file     Allergies   Shellfish allergy and Iodine   Review of Systems Review of Systems  Constitutional: Negative for chills and fever.  HENT: Positive for sore throat. Negative for ear pain.   Eyes: Negative for pain and visual disturbance.  Respiratory: Negative for cough and shortness of breath.   Cardiovascular: Negative for chest pain and palpitations.  Gastrointestinal: Negative for abdominal pain and vomiting.  Genitourinary: Negative for dysuria and hematuria.  Musculoskeletal: Negative for arthralgias and back pain.  Skin: Negative for color change and rash.  Neurological: Negative for seizures and syncope.  All other systems reviewed and are negative.    Physical Exam Triage Vital Signs ED Triage Vitals  Enc Vitals Group     BP 10/14/18 0853 122/75     Pulse Rate 10/14/18 0853 76     Resp 10/14/18 0853 18  Temp 10/14/18 0853 98.2 F (36.8 C)     Temp src --      SpO2 10/14/18 0853 100 %     Weight --      Height --      Head Circumference --      Peak Flow --      Pain Score 10/14/18 0854 8     Pain Loc --      Pain Edu? --      Excl. in GC? --    No data found.  Updated Vital Signs BP 122/75   Pulse 76   Temp 98.2 F (36.8 C)   Resp 18   LMP 09/30/2018   SpO2 100%   Visual Acuity Right Eye Distance:   Left Eye Distance:   Bilateral Distance:    Right Eye Near:   Left Eye Near:    Bilateral Near:     Physical Exam Constitutional:      General: She is not in acute distress.    Appearance: She is well-developed.  HENT:     Head: Normocephalic and atraumatic.     Right Ear: Tympanic membrane and ear canal normal.     Left Ear: Tympanic membrane and ear canal normal.     Mouth/Throat:     Mouth: Mucous membranes are moist.     Pharynx: Posterior oropharyngeal erythema present.     Tonsils: No tonsillar exudate. Swelling: 2+ on the  right. 2+ on the left.  Eyes:     Conjunctiva/sclera: Conjunctivae normal.     Pupils: Pupils are equal, round, and reactive to light.  Neck:     Musculoskeletal: Normal range of motion.  Cardiovascular:     Rate and Rhythm: Normal rate and regular rhythm.     Heart sounds: Normal heart sounds.  Pulmonary:     Effort: Pulmonary effort is normal. No respiratory distress.     Breath sounds: Normal breath sounds.  Abdominal:     General: There is no distension.     Palpations: Abdomen is soft.  Musculoskeletal: Normal range of motion.  Skin:    General: Skin is warm and dry.  Neurological:     Mental Status: She is alert.  Psychiatric:        Mood and Affect: Mood normal.        Behavior: Behavior normal.      UC Treatments / Results  Labs (all labs ordered are listed, but only abnormal results are displayed) Labs Reviewed - No data to display  EKG None  Radiology No results found.  Procedures Procedures (including critical care time)  Medications Ordered in UC Medications - No data to display  Initial Impression / Assessment and Plan / UC Course  I have reviewed the triage vital signs and the nursing notes.  Pertinent labs & imaging results that were available during my care of the patient were reviewed by me and considered in my medical decision making (see chart for details).      Final Clinical Impressions(s) / UC Diagnoses   Final diagnoses:  Strep pharyngitis     Discharge Instructions     Continue to drink plenty of fluids Continue allergy medicine and humidifier Take amoxicillin 2 times a day for 10 days Take 2 doses today Return as needed   ED Prescriptions    Medication Sig Dispense Auth. Provider   amoxicillin (AMOXIL) 875 MG tablet Take 1 tablet (875 mg total) by mouth 2 (two) times daily for 10  days. 20 tablet Eustace Moore, MD     Controlled Substance Prescriptions [place] Controlled Substance Registry consulted? Not Applicable     Eustace Moore, MD 10/14/18 2030

## 2018-10-14 NOTE — Discharge Instructions (Signed)
Continue to drink plenty of fluids Continue allergy medicine and humidifier Take amoxicillin 2 times a day for 10 days Take 2 doses today Return as needed

## 2018-10-14 NOTE — ED Triage Notes (Signed)
Pt states she was seen on 2/27 for sore throat, had strep culture which showed non group A, had called patient and she states she was better but now its gotten worse again.

## 2019-01-06 ENCOUNTER — Other Ambulatory Visit: Payer: Self-pay

## 2019-01-06 ENCOUNTER — Encounter (HOSPITAL_COMMUNITY): Payer: Self-pay

## 2019-01-06 ENCOUNTER — Ambulatory Visit (HOSPITAL_COMMUNITY)
Admission: EM | Admit: 2019-01-06 | Discharge: 2019-01-06 | Disposition: A | Discharge status: Home / Self Care | Payer: Medicaid Other | Attending: Internal Medicine | Admitting: Internal Medicine

## 2019-01-06 DIAGNOSIS — B9789 Other viral agents as the cause of diseases classified elsewhere: Secondary | ICD-10-CM

## 2019-01-06 DIAGNOSIS — J4521 Mild intermittent asthma with (acute) exacerbation: Secondary | ICD-10-CM

## 2019-01-06 DIAGNOSIS — J988 Other specified respiratory disorders: Secondary | ICD-10-CM

## 2019-01-06 MED ORDER — PREDNISONE 50 MG PO TABS: 50.0000 mg | ORAL_TABLET | Freq: Every day | ORAL | 0 refills | Status: DC

## 2019-01-06 MED ORDER — ALBUTEROL SULFATE HFA 108 (90 BASE) MCG/ACT IN AERS: 1.0000 | INHALATION_SPRAY | RESPIRATORY_TRACT | 0 refills | Status: DC | PRN

## 2019-01-06 MED ORDER — MONTELUKAST SODIUM 10 MG PO TABS: 10.0000 mg | ORAL_TABLET | Freq: Every day | ORAL | 0 refills | Status: DC

## 2019-01-06 NOTE — ED Triage Notes (Signed)
Patient presents to Urgent Care with complaints of shortness of breath, slight cough since the past few days. Patient reports she has been using her inhaler a little more than normal, used three times today. Pt states sometimes with the humidity, her breathing is worse. Pt also states she had a low temp while at work today: 100.4.

## 2019-01-06 NOTE — Discharge Instructions (Addendum)
Symptoms today are consistent with viral respiratory infection, including possible covid infection, and/or asthma exacerbation.  I have recommended you for testing; the [facility] test site will contact you by phone with a date/time for your test.  Please stay home and limit close contact with others until test results are known.  Prescriptions for prednisone, albuterol inhaler, and singulair were sent to the pharmacy.  Anticipate gradual improvement in cough and breathlessness over the next several days.  Push fluids and rest.  Go to ED if severe breathlessness occurs.  Recheck for new fever >100.5, increasing phlegm production/nasal discharge, or if not starting to improve in a few days.

## 2019-01-06 NOTE — ED Provider Notes (Signed)
[facility]    CSN: [id number] Arrival date & time: 01/06/19  1815     History   Chief Complaint Chief Complaint  Patient presents with  . Appointment  . Shortness of Breath    HPI Nichole Morris is a 33 y.o. female.  She has past hx of asthma.  She presents today with about a week's hx of increasing breathlessness, particularly HS, and cough, with fever to 100.4 today.  Nose feels dry/congested.  No N/V/D.  No sick contacts.  Works as a Architectural technologisthome caregiver.  Had pneumonia last year and has had some cough since; feels like she never quite returned to baseline.  Manages asthma with albuterol inhaler and singulair; out of singulair.      HPI  Past Medical History:  Diagnosis Date  . Asthma     There are no active problems to display for this patient.   Past Surgical History:  Procedure Laterality Date  . CESAREAN SECTION      OB History    Gravida  1   Para      Term      Preterm      AB      Living        SAB      TAB      Ectopic      Multiple      Live Births               Home Medications    Prior to Admission medications   Medication Sig Start Date End Date Taking? Authorizing Provider  albuterol (VENTOLIN HFA) 108 (90 Base) MCG/ACT inhaler Inhale 1-2 puffs into the lungs every 4 (four) hours as needed for wheezing or shortness of breath. 01/06/19   Isa RankinMurray, Whyatt Klinger Wilson, MD  Cetirizine HCl 10 MG CAPS Take 1 capsule (10 mg total) by mouth daily for 10 days. 08/30/18 09/09/18  Wieters, Hallie C, PA-C  ibuprofen (ADVIL,MOTRIN) 600 MG tablet Take 1 tablet (600 mg total) by mouth every 6 (six) hours as needed. 08/30/18   Wieters, Hallie C, PA-C  magic mouthwash w/lidocaine SOLN Take 10 mLs by mouth every 2 (two) hours as needed for mouth pain. 08/31/18   Elvina SidleLauenstein, Kurt, MD  montelukast (SINGULAIR) 10 MG tablet Take 1 tablet (10 mg total) by mouth at bedtime for 30 days. 01/06/19 02/05/19  Isa RankinMurray, Amonda Brillhart Wilson, MD  predniSONE (DELTASONE)  50 MG tablet Take 1 tablet (50 mg total) by mouth daily. 01/06/19   Isa RankinMurray, Lyriq Jarchow Wilson, MD    Family History Family History  Problem Relation Age of Onset  . Bipolar disorder Mother   . Diabetes Mother   . Hypertension Mother   . Heart failure Father     Social History Social History   Tobacco Use  . Smoking status: Never Smoker  . Smokeless tobacco: Never Used  Substance Use Topics  . Alcohol use: No    Frequency: Never  . Drug use: Not on file     Allergies   Shellfish allergy and Iodine   Review of Systems Review of Systems  All other systems reviewed and are negative.    Physical Exam Triage Vital Signs ED Triage Vitals  Enc Vitals Group     BP 01/06/19 1839 107/72     Pulse Rate 01/06/19 1839 82     Resp 01/06/19 1839 18     Temp 01/06/19 1839 99.2 F (37.3 C)     Temp Source 01/06/19 1839 Oral  SpO2 01/06/19 1839 100 %     Weight --      Height --      Pain Score 01/06/19 1837 0     Pain Loc --    Updated Vital Signs BP 107/72 (BP Location: Left Arm)   Pulse 82   Temp 99.2 F (37.3 C) (Oral)   Resp 18   SpO2 100%   Visual Acuity Right Eye Distance:   Left Eye Distance:   Bilateral Distance:    Right Eye Near:   Left Eye Near:    Bilateral Near:     Physical Exam Vitals signs and nursing note reviewed.  Constitutional:      General: She is not in acute distress.    Comments: Alert, nicely groomed  HENT:     Head: Atraumatic.     Comments: B TMs slightly dull, no erythema, minor wax Mod nasal congestion bilat Throat unremarkable Slightly dry mucous membranes Eyes:     Comments: Conjugate gaze, no eye redness/drainage  Neck:     Musculoskeletal: Neck supple.  Cardiovascular:     Rate and Rhythm: Normal rate and regular rhythm.  Pulmonary:     Effort: No respiratory distress.     Breath sounds: No wheezing or rales.     Comments: Coarse but symmetric breath sounds throughout Abdominal:     General: There is no distension.   Musculoskeletal: Normal range of motion.        General: No swelling.     Comments: No leg swelling  Skin:    General: Skin is warm and dry.     Comments: No cyanosis  Neurological:     Mental Status: She is alert and oriented to person, place, and time.       Final Clinical Impressions(s) / UC Diagnoses   Final diagnoses:  Viral respiratory illness  Mild intermittent asthmatic bronchitis with acute exacerbation     Discharge Instructions     Symptoms today are consistent with viral respiratory infection, including possible covid infection, and/or asthma exacerbation.  I have recommended you for testing; the [facility] test site will contact you by phone with a date/time for your test.  Please stay home and limit close contact with others until test results are known.  Prescriptions for prednisone, albuterol inhaler, and singulair were sent to the pharmacy.  Anticipate gradual improvement in cough and breathlessness over the next several days.  Push fluids and rest.  Go to ED if severe breathlessness occurs.  Recheck for new fever >100.5, increasing phlegm production/nasal discharge, or if not starting to improve in a few days.      ED Prescriptions    Medication Sig Dispense Auth. Provider   predniSONE (DELTASONE) 50 MG tablet Take 1 tablet (50 mg total) by mouth daily. 3 tablet Isa Rankin, MD   montelukast (SINGULAIR) 10 MG tablet Take 1 tablet (10 mg total) by mouth at bedtime for 30 days. 30 tablet Isa Rankin, MD   albuterol (VENTOLIN HFA) 108 (90 Base) MCG/ACT inhaler Inhale 1-2 puffs into the lungs every 4 (four) hours as needed for wheezing or shortness of breath. 1 Inhaler Isa Rankin, MD        Isa Rankin, MD 01/06/19 [phone]

## 2019-01-11 ENCOUNTER — Telehealth: Payer: Self-pay

## 2019-01-11 ENCOUNTER — Other Ambulatory Visit: Payer: Medicaid Other

## 2019-01-11 DIAGNOSIS — Z20822 Contact with and (suspected) exposure to covid-19: Secondary | ICD-10-CM

## 2019-01-11 NOTE — Telephone Encounter (Signed)
Dr. Dayton Scrape request COVID 19 request test.

## 2019-01-13 LAB — NOVEL CORONAVIRUS, NAA: SARS-CoV-2, NAA: NOT DETECTED

## 2019-01-17 ENCOUNTER — Emergency Department (HOSPITAL_COMMUNITY): Payer: Medicaid Other

## 2019-01-17 ENCOUNTER — Other Ambulatory Visit: Payer: Self-pay

## 2019-01-17 ENCOUNTER — Encounter (HOSPITAL_COMMUNITY): Payer: Self-pay | Admitting: Emergency Medicine

## 2019-01-17 ENCOUNTER — Emergency Department (HOSPITAL_COMMUNITY)
Admission: EM | Admit: 2019-01-17 | Discharge: 2019-01-18 | Discharge status: Against Medical Advice | Payer: Medicaid Other | Attending: Emergency Medicine | Admitting: Emergency Medicine

## 2019-01-17 DIAGNOSIS — Z5321 Procedure and treatment not carried out due to patient leaving prior to being seen by health care provider: Secondary | ICD-10-CM | POA: Insufficient documentation

## 2019-01-17 LAB — CBC
HCT: 39.4 % (ref 36.0–46.0)
Hemoglobin: 12.8 g/dL (ref 12.0–15.0)
MCH: 27.2 pg (ref 26.0–34.0)
MCHC: 32.5 g/dL (ref 30.0–36.0)
MCV: 83.8 fL (ref 80.0–100.0)
Platelets: 270 10*3/uL (ref 150–400)
RBC: 4.7 MIL/uL (ref 3.87–5.11)
RDW: 13 % (ref 11.5–15.5)
WBC: 8.4 10*3/uL (ref 4.0–10.5)
nRBC: 0 % (ref 0.0–0.2)

## 2019-01-17 LAB — I-STAT BETA HCG BLOOD, ED (MC, WL, AP ONLY): I-stat hCG, quantitative: <5 m[IU]/mL (ref <5)

## 2019-01-17 MED ORDER — SODIUM CHLORIDE 0.9% FLUSH: 3.0000 mL | Freq: Once | INTRAVENOUS | Status: DC

## 2019-01-17 NOTE — ED Triage Notes (Addendum)
Pt c/o sudden onset sharp chest pain, radiating to her left arm that started tonight. Denies shortness of breath.

## 2019-01-18 LAB — TROPONIN I: Troponin I: <0.03 ng/mL (ref <0.03)

## 2019-01-18 LAB — BASIC METABOLIC PANEL
Anion gap: 11 (ref 5–15)
BUN: 10 mg/dL (ref 6–20)
CO2: 22 mmol/L (ref 22–32)
Calcium: 9.5 mg/dL (ref 8.9–10.3)
Chloride: 104 mmol/L (ref 98–111)
Creatinine, Ser: 0.68 mg/dL (ref 0.44–1.00)
GFR calc Af Amer: >60 mL/min (ref >60)
GFR calc non Af Amer: >60 mL/min (ref >60)
Glucose, Bld: 95 mg/dL (ref 70–99)
Potassium: 4 mmol/L (ref 3.5–5.1)
Sodium: 137 mmol/L (ref 135–145)

## 2019-01-18 NOTE — ED Notes (Signed)
Pt. came to this tech wanting to leave without being seen. This tech encouraged pt. to stay and be seen by provider. Pt. stated "I am feeling much better and need to get to my baby at home." This tech instructed pt.to return if symptoms worsened.

## 2019-08-16 ENCOUNTER — Encounter: Payer: Self-pay | Admitting: Family Medicine

## 2019-08-16 ENCOUNTER — Other Ambulatory Visit: Payer: Self-pay

## 2019-08-16 ENCOUNTER — Ambulatory Visit: Payer: Self-pay | Attending: Family Medicine | Admitting: Family Medicine

## 2019-08-16 VITALS — Ht 61.0 in | Wt 200.0 lb

## 2019-08-16 DIAGNOSIS — J4530 Mild persistent asthma, uncomplicated: Secondary | ICD-10-CM

## 2019-08-16 DIAGNOSIS — N644 Mastodynia: Secondary | ICD-10-CM

## 2019-08-16 DIAGNOSIS — G479 Sleep disorder, unspecified: Secondary | ICD-10-CM

## 2019-08-16 DIAGNOSIS — M25512 Pain in left shoulder: Secondary | ICD-10-CM

## 2019-08-16 MED ORDER — TRAZODONE HCL 50 MG PO TABS: 25.0000 mg | ORAL_TABLET | Freq: Every evening | ORAL | 3 refills | Status: DC | PRN

## 2019-08-16 MED ORDER — FLUTICASONE-SALMETEROL 100-50 MCG/DOSE IN AEPB: 1.0000 | INHALATION_SPRAY | Freq: Two times a day (BID) | RESPIRATORY_TRACT | 3 refills | Status: DC

## 2019-08-16 MED ORDER — ALBUTEROL SULFATE HFA 108 (90 BASE) MCG/ACT IN AERS: 2.0000 | INHALATION_SPRAY | Freq: Four times a day (QID) | RESPIRATORY_TRACT | 11 refills | Status: DC | PRN

## 2019-08-16 MED FILL — FLUTICASONE-SALMETEROL 100-: 100-50 | 30 days supply | Qty: 60 | Fill #0

## 2019-08-16 NOTE — Progress Notes (Signed)
Virtual Visit via Telephone Note  I connected with Nichole Morris on 08/16/19 at  1:50 PM EST by telephone and verified that I am speaking with the correct person using two identifiers.   I discussed the limitations, risks, security and privacy concerns of performing an evaluation and management service by telephone and the availability of in person appointments. I also discussed with the patient that there may be a patient responsible charge related to this service. The patient expressed understanding and agreed to proceed.  Patient Location: Home Provider Location: [facility] Others participating in call: none   History of Present Illness:      34 yo female new to the practice who has the complaint of pain in the left shoulder for the past 2 months. 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  She does not have any radiation of pain in the shoulder and no neck pain.  She does not believe she has had any prior shoulder or neck injury.  She denies any numbness or tingling in her left hand or fingertips.  Use of over-the-counter meds Patient has slightly decrease the pain but pain returns whenever she has to use her left hand/arm such as when carrying or lifting objects.        She also reports a history of asthma and continues to have issues with shortness of breath with some activities or with exposure to cold air.  She does occasionally also continue to have some nighttime issues with cough, sensation of chest congestion or tightness and occasional sensation of wheezing which may have been every few weeks or every other week.  She does need an albuterol inhaler refill.  She believes she also used Advair in the past which helped but she has not had a recent prescription for this medication.         She denies any chest pain or palpitations, no headaches or dizziness.  She does have fatigue.  She denies any abdominal pain-no  nausea/vomiting/diarrhea or constipation.  She reports that she does have difficulty with falling asleep.  She does not believe that she is having any significant anxiety or depression.  She has also noticed that she will occasionally have some breast discomfort but this comes and goes.  She has noticed no changes to the skin in the breast area, no nipple discharge and no enlarged lymph nodes in her armpit area.  Past Medical History:  Diagnosis Date   Asthma     Past Surgical History:  Procedure Laterality Date   CESAREAN SECTION      Family History  Problem Relation Age of Onset   Bipolar disorder Mother    Diabetes Mother    Hypertension Mother    Heart failure Father     Social History   Tobacco Use   Smoking status: Never Smoker   Smokeless tobacco: Never Used  Substance Use Topics   Alcohol use: No   Drug use: Not on file     Allergies  Allergen Reactions   Shellfish Allergy Anaphylaxis, Shortness Of Breath and Swelling   Iodine Rash       Observations/Objective: No vital signs or physical exam conducted as visit was done via telephone  Assessment and Plan: 1. Mild persistent asthma without complication She reports mild persistent asthma symptoms and refill provided of albuterol inhaler and patient has been asked to start Advair 100/50 mcg which she believes  that she used in the past, to see if patient will have reduction in symptoms of shortness of breath and nighttime sensation of chest congestion/wheeze.  Patient has been asked to return to clinic in approximately 6 weeks for reevaluation but sooner if any worsening of asthma or any other concerns - albuterol (VENTOLIN HFA) 108 (90 Base) MCG/ACT inhaler; Inhale 2 puffs into the lungs every 6 (six) hours as needed for wheezing or shortness of breath.  Dispense: 18 g; Refill: 11 - Fluticasone-Salmeterol (ADVAIR) 100-50 MCG/DOSE AEPB; Inhale 1 puff into the lungs 2 (two) times daily.  Dispense: 1 each;  Refill: 3  2. Acute pain of left shoulder She was encouraged to make appointment with financial counselors to apply for [facility] financial assistance program to help with cost of referral to orthopedics for further evaluation.  In the interim, she may take nonsteroidal anti-inflammatories if these do not tend to trigger her asthma.  She should avoid lifting greater than 10 pounds over the next 2 weeks.  Call or return sooner if pain worsens or is not improving  3. Difficulty sleeping Sleep hygiene reviewed and prescription provided for trazodone to take at bedtime to help with sleep. - traZODone (DESYREL) 50 MG tablet; Take 0.5-1 tablets (25-50 mg total) by mouth at bedtime as needed for sleep.  Dispense: 30 tablet; Refill: 3  4. Breast tenderness in female Patient should try to see if there is a pattern to her breast tenderness such as occurring within a certain amount of time before or after her menses.  She should also decrease caffeine intake to see if this helps with her breast tenderness as caffeine intake can worsen issues with fibrocystic breast disorder.  If she develops persistent breast tenderness, breast pain, nipple or skin changes, axillary adenopathy or any other concerns, she should notify the office so that ultrasound and diagnostic mammogram can be ordered for further evaluation.  Follow Up Instructions:Return in about 6 weeks (around 09/27/2019) for asthma/sleep/new meds.   I discussed the assessment and treatment plan with the patient. The patient was provided an opportunity to ask questions and all were answered. The patient agreed with the plan and demonstrated an understanding of the instructions.   The patient was advised to call back or seek an in-person evaluation if the symptoms worsen or if the condition fails to improve as anticipated.  I provided 16 minutes of non-face-to-face time during this encounter.   Cain Saupe, MD

## 2019-08-24 ENCOUNTER — Ambulatory Visit
Admission: EM | Admit: 2019-08-24 | Discharge: 2019-08-24 | Disposition: A | Discharge status: Home / Self Care | Payer: Medicaid Other | Attending: Physician Assistant | Admitting: Physician Assistant

## 2019-08-24 DIAGNOSIS — J029 Acute pharyngitis, unspecified: Secondary | ICD-10-CM

## 2019-08-24 DIAGNOSIS — Z20822 Contact with and (suspected) exposure to covid-19: Secondary | ICD-10-CM

## 2019-08-24 LAB — POCT RAPID STREP A (OFFICE): Rapid Strep A Screen: NEGATIVE

## 2019-08-24 NOTE — ED Triage Notes (Signed)
Pt c/o sore throat and body aches since last night

## 2019-08-24 NOTE — Discharge Instructions (Signed)
Rapid strep negative, culture sent. COVID PCR testing ordered. I would like you to quarantine until testing results. You can take over the counter flonase/nasacort to help with nasal congestion/drainage. Tylenol/motrin for pain and fever. Keep hydrated, urine should be clear to pale yellow in color. If experiencing shortness of breath, trouble breathing, go to the emergency department for further evaluation needed.

## 2019-08-24 NOTE — ED Provider Notes (Signed)
[facility]    CSN: [id number] Arrival date & time: 08/24/19  1626      History   Chief Complaint Chief Complaint  Patient presents with  . Sore Throat    HPI Nichole Morris is a 34 y.o. female.   34 year old female comes in for 2 day of URI symptoms. Sore throat, body aches, cough. Denies fever, chills. Denies abdominal pain, nausea, vomiting, diarrhea. Denies shortness of breath, loss of taste/smell. Has frequent strep/tonsillitis. Denies tripoding, drooling, trismus.      Past Medical History:  Diagnosis Date  . Asthma     There are no problems to display for this patient.   Past Surgical History:  Procedure Laterality Date  . CESAREAN SECTION      OB History    Gravida  1   Para      Term      Preterm      AB      Living        SAB      TAB      Ectopic      Multiple      Live Births               Home Medications    Prior to Admission medications   Medication Sig Start Date End Date Taking? Authorizing Provider  albuterol (VENTOLIN HFA) 108 (90 Base) MCG/ACT inhaler Inhale 2 puffs into the lungs every 6 (six) hours as needed for wheezing or shortness of breath. 08/16/19   Fulp, Cammie, MD  Cetirizine HCl 10 MG CAPS Take 1 capsule (10 mg total) by mouth daily for 10 days. 08/30/18 09/09/18  Wieters, Hallie C, PA-C  Fluticasone-Salmeterol (ADVAIR) 100-50 MCG/DOSE AEPB Inhale 1 puff into the lungs 2 (two) times daily. 08/16/19   Fulp, Cammie, MD  ibuprofen (ADVIL,MOTRIN) 600 MG tablet Take 1 tablet (600 mg total) by mouth every 6 (six) hours as needed. Patient not taking: Reported on 08/16/2019 08/30/18   Wieters, Madelynn Done C, PA-C  magic mouthwash w/lidocaine SOLN Take 10 mLs by mouth every 2 (two) hours as needed for mouth pain. Patient not taking: Reported on 08/16/2019 08/31/18   Robyn Haber, MD  predniSONE (DELTASONE) 50 MG tablet Take 1 tablet (50 mg total) by mouth daily. Patient not taking: Reported on 08/16/2019 01/06/19    Wynona Luna, MD  traZODone (DESYREL) 50 MG tablet Take 0.5-1 tablets (25-50 mg total) by mouth at bedtime as needed for sleep. 08/16/19   Antony Blackbird, MD    Family History Family History  Problem Relation Age of Onset  . Bipolar disorder Mother   . Diabetes Mother   . Hypertension Mother   . Heart failure Father     Social History Social History   Tobacco Use  . Smoking status: Never Smoker  . Smokeless tobacco: Never Used  Substance Use Topics  . Alcohol use: No  . Drug use: Not on file     Allergies   Shellfish allergy and Iodine   Review of Systems Review of Systems  Reason unable to perform ROS: See HPI as above.     Physical Exam Triage Vital Signs ED Triage Vitals  Enc Vitals Group     BP 08/24/19 1653 109/69     Pulse Rate 08/24/19 1653 81     Resp 08/24/19 1653 16     Temp 08/24/19 1653 98.2 F (36.8 C)     Temp Source 08/24/19 1653 Oral  SpO2 08/24/19 1653 98 %     Weight --      Height --      Head Circumference --      Peak Flow --      Pain Score 08/24/19 1654 6     Pain Loc --      Pain Edu? --      Excl. in GC? --    No data found.  Updated Vital Signs BP 109/69 (BP Location: Left Arm)   Pulse 81   Temp 98.2 F (36.8 C) (Oral)   Resp 16   LMP 08/13/2019   SpO2 98%    Physical Exam Constitutional:      General: She is not in acute distress.    Appearance: Normal appearance. She is not ill-appearing, toxic-appearing or diaphoretic.  HENT:     Head: Normocephalic and atraumatic.     Mouth/Throat:     Mouth: Mucous membranes are moist.     Pharynx: Oropharynx is clear. Uvula midline.     Tonsils: No tonsillar exudate. 2+ on the right. 2+ on the left.  Cardiovascular:     Rate and Rhythm: Normal rate and regular rhythm.  Pulmonary:     Effort: Pulmonary effort is normal. No accessory muscle usage, prolonged expiration, respiratory distress or retractions.     Comments: Lungs clear to auscultation without adventitious  lung sounds. Musculoskeletal:     Cervical back: Normal range of motion and neck supple.  Skin:    General: Skin is warm and dry.  Neurological:     General: No focal deficit present.     Mental Status: She is alert and oriented to person, place, and time.      UC Treatments / Results  Labs (all labs ordered are listed, but only abnormal results are displayed) Labs Reviewed  POCT RAPID STREP A (OFFICE) - Normal  NOVEL CORONAVIRUS, NAA  CULTURE, GROUP A STREP [facility])    EKG   Radiology No results found.  Procedures Procedures (including critical care time)  Medications Ordered in UC Medications - No data to display  Initial Impression / Assessment and Plan / UC Course  I have reviewed the triage vital signs and the nursing notes.  Pertinent labs & imaging results that were available during my care of the patient were reviewed by me and considered in my medical decision making (see chart for details).    Rapid strep negative. COVID PCR test ordered. Patient to quarantine until testing results return. No alarming signs on exam.  Patient speaking in full sentences without respiratory distress.  Symptomatic treatment discussed.  Push fluids.  Return precautions given.  Patient expresses understanding and agrees to plan.  Final Clinical Impressions(s) / UC Diagnoses   Final diagnoses:  Sore throat   ED Prescriptions    None     PDMP not reviewed this encounter.   Belinda Fisher, PA-C 08/24/19 1725

## 2019-08-25 LAB — NOVEL CORONAVIRUS, NAA: SARS-CoV-2, NAA: NOT DETECTED

## 2019-08-27 LAB — CULTURE, GROUP A STREP (THRC)

## 2019-08-29 ENCOUNTER — Ambulatory Visit: Payer: Medicaid Other | Attending: Internal Medicine

## 2019-08-29 DIAGNOSIS — Z20822 Contact with and (suspected) exposure to covid-19: Secondary | ICD-10-CM

## 2019-08-30 LAB — NOVEL CORONAVIRUS, NAA: SARS-CoV-2, NAA: NOT DETECTED

## 2019-09-17 DIAGNOSIS — J4530 Mild persistent asthma, uncomplicated: Secondary | ICD-10-CM | POA: Insufficient documentation

## 2019-09-17 DIAGNOSIS — G479 Sleep disorder, unspecified: Secondary | ICD-10-CM | POA: Insufficient documentation

## 2020-08-19 ENCOUNTER — Encounter: Payer: Self-pay | Admitting: Emergency Medicine

## 2020-08-19 ENCOUNTER — Other Ambulatory Visit: Payer: Self-pay

## 2020-08-19 ENCOUNTER — Ambulatory Visit
Admission: EM | Admit: 2020-08-19 | Discharge: 2020-08-19 | Disposition: A | Discharge status: Home / Self Care | Payer: BC Managed Care – PPO | Attending: Internal Medicine | Admitting: Internal Medicine

## 2020-08-19 DIAGNOSIS — J029 Acute pharyngitis, unspecified: Secondary | ICD-10-CM

## 2020-08-19 DIAGNOSIS — J4521 Mild intermittent asthma with (acute) exacerbation: Secondary | ICD-10-CM

## 2020-08-19 DIAGNOSIS — J4530 Mild persistent asthma, uncomplicated: Secondary | ICD-10-CM

## 2020-08-19 MED ORDER — ONDANSETRON 4 MG PO TBDP: 4.0000 mg | ORAL_TABLET | Freq: Three times a day (TID) | ORAL | 0 refills | Status: DC | PRN

## 2020-08-19 MED ORDER — BENZONATATE 100 MG PO CAPS: 100.0000 mg | ORAL_CAPSULE | Freq: Three times a day (TID) | ORAL | 0 refills | Status: DC

## 2020-08-19 MED ORDER — IBUPROFEN 600 MG PO TABS: 600.0000 mg | ORAL_TABLET | Freq: Four times a day (QID) | ORAL | 0 refills | Status: DC | PRN

## 2020-08-19 MED ORDER — ALBUTEROL SULFATE HFA 108 (90 BASE) MCG/ACT IN AERS: 2.0000 | INHALATION_SPRAY | Freq: Four times a day (QID) | RESPIRATORY_TRACT | 11 refills | Status: DC | PRN

## 2020-08-19 NOTE — ED Triage Notes (Signed)
Pt here for cold sx onset 3 days associated w/fevers, body aches, cough, wheezing, diarrhea  Taking OTC Mucinex and Vit C and zinc, and rescue inhalers.   A&O x4... NAD.Marland Kitchen. ambulatory

## 2020-08-19 NOTE — Discharge Instructions (Addendum)
Increase oral fluid intake Take medications as directed Please quarantine until COVID-19 test results are available We will call you with results if labs are abnormal

## 2020-08-20 NOTE — ED Provider Notes (Addendum)
[facility]    CSN: [id number] Arrival date & time: 08/19/20  1556      History   Chief Complaint Chief Complaint  Patient presents with  . URI    HPI Nichole Morris is a 35 y.o. female comes to urgent care with complaints of generalized body aches, subjective fevers, cough, wheezing and diarrhea of 3 days duration.  Patient's symptoms started 3 days ago and has been persistent.  She complains of some chest tightness and wheezing but no chest pain.  No sputum production.  Patient is vaccinated against COVID-19 virus.  She has tried over-the-counter Mucinex, vitamin C, zinc as well as inhaler use with no improvement.   HPI  Past Medical History:  Diagnosis Date  . Asthma     Patient Active Problem List   Diagnosis Date Noted  . Mild persistent asthma without complication 09/17/2019  . Difficulty sleeping 09/17/2019    Past Surgical History:  Procedure Laterality Date  . CESAREAN SECTION      OB History    Gravida  1   Para      Term      Preterm      AB      Living        SAB      IAB      Ectopic      Multiple      Live Births               Home Medications    Prior to Admission medications   Medication Sig Start Date End Date Taking? Authorizing Provider  benzonatate (TESSALON) 100 MG capsule Take 1 capsule (100 mg total) by mouth every 8 (eight) hours. 08/19/20  Yes Lamptey, Britta Mccreedy, MD  ibuprofen (ADVIL) 600 MG tablet Take 1 tablet (600 mg total) by mouth every 6 (six) hours as needed. 08/19/20  Yes Lamptey, Britta Mccreedy, MD  ondansetron (ZOFRAN ODT) 4 MG disintegrating tablet Take 1 tablet (4 mg total) by mouth every 8 (eight) hours as needed for nausea or vomiting. 08/19/20  Yes Lamptey, Britta Mccreedy, MD  albuterol (VENTOLIN HFA) 108 (90 Base) MCG/ACT inhaler Inhale 2 puffs into the lungs every 6 (six) hours as needed for wheezing or shortness of breath. 08/19/20   Lamptey, Britta Mccreedy, MD  Cetirizine HCl 10 MG CAPS Take 1 capsule  (10 mg total) by mouth daily for 10 days. 08/30/18 09/09/18  Wieters, Hallie C, PA-C  Fluticasone-Salmeterol (ADVAIR) 100-50 MCG/DOSE AEPB Inhale 1 puff into the lungs 2 (two) times daily. 08/16/19   Fulp, Cammie, MD  traZODone (DESYREL) 50 MG tablet Take 0.5-1 tablets (25-50 mg total) by mouth at bedtime as needed for sleep. 08/16/19   Cain Saupe, MD    Family History Family History  Problem Relation Age of Onset  . Bipolar disorder Mother   . Diabetes Mother   . Hypertension Mother   . Heart failure Father     Social History Social History   Tobacco Use  . Smoking status: Never Smoker  . Smokeless tobacco: Never Used  Vaping Use  . Vaping Use: Never used  Substance Use Topics  . Alcohol use: No     Allergies   Shellfish allergy and Iodine   Review of Systems Review of Systems  Constitutional: Positive for fever. Negative for chills.  HENT: Positive for sore throat. Negative for sinus pressure and sneezing.   Respiratory: Positive for cough, shortness of breath and wheezing.   Gastrointestinal: Positive  for diarrhea. Negative for nausea and vomiting.  Neurological: Negative.      Physical Exam Triage Vital Signs ED Triage Vitals  Enc Vitals Group     BP 08/19/20 2028 124/78     Pulse Rate 08/19/20 2028 93     Resp 08/19/20 2028 20     Temp 08/19/20 2028 98.1 F (36.7 C)     Temp Source 08/19/20 2028 Oral     SpO2 08/19/20 2028 99 %     Weight --      Height --      Head Circumference --      Peak Flow --      Pain Score 08/19/20 1929 0     Pain Loc --      Pain Edu? --      Excl. in GC? --    No data found.  Updated Vital Signs BP 124/78 (BP Location: Left Arm)   Pulse 93   Temp 98.1 F (36.7 C) (Oral)   Resp 20   LMP 08/19/2020   SpO2 99%   Breastfeeding No   Visual Acuity Right Eye Distance:   Left Eye Distance:   Bilateral Distance:    Right Eye Near:   Left Eye Near:    Bilateral Near:     Physical Exam Vitals and nursing note  reviewed.  Constitutional:      General: She is not in acute distress.    Appearance: She is not ill-appearing.  Cardiovascular:     Rate and Rhythm: Normal rate and regular rhythm.     Pulses: Normal pulses.     Heart sounds: Normal heart sounds.  Pulmonary:     Effort: Pulmonary effort is normal.     Breath sounds: Normal breath sounds.  Abdominal:     General: Bowel sounds are normal.     Palpations: Abdomen is soft.  Neurological:     Mental Status: She is alert.      UC Treatments / Results  Labs (all labs ordered are listed, but only abnormal results are displayed) Labs Reviewed  NOVEL CORONAVIRUS, NAA    EKG   Radiology No results found.  Procedures Procedures (including critical care time)  Medications Ordered in UC Medications - No data to display  Initial Impression / Assessment and Plan / UC Course  I have reviewed the triage vital signs and the nursing notes.  Pertinent labs & imaging results that were available during my care of the patient were reviewed by me and considered in my medical decision making (see chart for details).     1.  Acute pharyngitis: Warm salt water gargle Tylenol as needed for fever and/or pain Tessalon Perles as needed for cough Zofran as needed for nausea/vomiting Ibuprofen every 6 hours as needed for pain Return precautions given.  2.  Mild intermittent asthma with acute exacerbation: Albuterol refilled No indication for steroids as patient has no wheezing at this time Return precautions given. Final Clinical Impressions(s) / UC Diagnoses   Final diagnoses:  Acute pharyngitis, unspecified etiology  Mild intermittent asthma with (acute) exacerbation     Discharge Instructions     Increase oral fluid intake Take medications as directed Please quarantine until COVID-19 test results are available We will call you with results if labs are abnormal   ED Prescriptions    Medication Sig Dispense Auth. Provider    albuterol (VENTOLIN HFA) 108 (90 Base) MCG/ACT inhaler Inhale 2 puffs into the lungs every 6 (six) hours  as needed for wheezing or shortness of breath. 18 g Merrilee Jansky, MD   ibuprofen (ADVIL) 600 MG tablet Take 1 tablet (600 mg total) by mouth every 6 (six) hours as needed. 30 tablet Lamptey, Britta Mccreedy, MD   ondansetron (ZOFRAN ODT) 4 MG disintegrating tablet Take 1 tablet (4 mg total) by mouth every 8 (eight) hours as needed for nausea or vomiting. 20 tablet Lamptey, Britta Mccreedy, MD   benzonatate (TESSALON) 100 MG capsule Take 1 capsule (100 mg total) by mouth every 8 (eight) hours. 21 capsule Lamptey, Britta Mccreedy, MD     PDMP not reviewed this encounter.   Merrilee Jansky, MD 08/20/20 1647    Merrilee Jansky, MD 08/20/20 [phone]

## 2020-08-21 LAB — SARS-COV-2, NAA 2 DAY TAT

## 2020-08-21 LAB — NOVEL CORONAVIRUS, NAA: SARS-CoV-2, NAA: DETECTED — AB

## 2020-08-22 ENCOUNTER — Encounter: Payer: Self-pay | Admitting: Adult Health

## 2020-08-22 ENCOUNTER — Other Ambulatory Visit: Payer: Self-pay | Admitting: Adult Health

## 2020-08-22 ENCOUNTER — Telehealth (HOSPITAL_COMMUNITY): Payer: Self-pay | Admitting: Pharmacist

## 2020-08-22 DIAGNOSIS — U071 COVID-19: Secondary | ICD-10-CM

## 2020-08-22 MED ORDER — MOLNUPIRAVIR EUA 200MG CAPSULE: 4.0000 | ORAL_CAPSULE | Freq: Two times a day (BID) | ORAL | 0 refills | Status: AC

## 2020-08-22 NOTE — Telephone Encounter (Signed)
Patient was prescribed oral covid treatment MOLNUPIRAVIR and treatment note was reviewed. Medication has been received by [facility] and reviewed for appropriateness.  Drug Interactions or Dosage Adjustments Noted: NONE  Delivery Method: PATIENT WILL PICK UP 08/23/20  Patient contacted for counseling on 08/22/2020 and verbalized understanding.   Delivery or Pick-Up Date: 08/23/2020   Larrie Kass 08/22/2020, 5:15 PM [facility] Pharmacist Phone# [phone]

## 2020-08-22 NOTE — Progress Notes (Signed)
Outpatient Oral COVID Treatment Note  I connected with Nichole Morris on 08/22/2020/4:50 PM by telephone and verified that I am speaking with the correct person using two identifiers.  I discussed the limitations, risks, security, and privacy concerns of performing an evaluation and management service by telephone and the availability of in person appointments. I also discussed with the patient that there may be a patient responsible charge related to this service. The patient expressed understanding and agreed to proceed.  Patient location: at home Provider location: at home  Diagnosis: COVID-19 infection  Purpose of visit: Discussion of potential use of Molnupiravir or Paxlovid, a new treatment for mild to moderate COVID-19 viral infection in non-hospitalized patients.   Subjective: Patient is a 35 y.o. female who has been diagnosed with COVID 19 viral infection.  Their symptoms began on Sunday with cough, nasal drainage and fatigue.  She was tested on 1/13 and positive.    LMP 08/17/2020, not currently sexually active.   Past Medical History:  Diagnosis Date  . Asthma     Allergies  Allergen Reactions  . Shellfish Allergy Anaphylaxis, Shortness Of Breath and Swelling  . Iodine Rash     Current Outpatient Medications:  .  albuterol (VENTOLIN HFA) 108 (90 Base) MCG/ACT inhaler, Inhale 2 puffs into the lungs every 6 (six) hours as needed for wheezing or shortness of breath., Disp: 18 g, Rfl: 11 .  benzonatate (TESSALON) 100 MG capsule, Take 1 capsule (100 mg total) by mouth every 8 (eight) hours., Disp: 21 capsule, Rfl: 0 .  Cetirizine HCl 10 MG CAPS, Take 1 capsule (10 mg total) by mouth daily for 10 days., Disp: 10 capsule, Rfl: 0 .  Fluticasone-Salmeterol (ADVAIR) 100-50 MCG/DOSE AEPB, Inhale 1 puff into the lungs 2 (two) times daily., Disp: 1 each, Rfl: 3 .  ibuprofen (ADVIL) 600 MG tablet, Take 1 tablet (600 mg total) by mouth every 6 (six) hours as needed., Disp: 30 tablet, Rfl:  0 .  ondansetron (ZOFRAN ODT) 4 MG disintegrating tablet, Take 1 tablet (4 mg total) by mouth every 8 (eight) hours as needed for nausea or vomiting., Disp: 20 tablet, Rfl: 0 .  traZODone (DESYREL) 50 MG tablet, Take 0.5-1 tablets (25-50 mg total) by mouth at bedtime as needed for sleep., Disp: 30 tablet, Rfl: 3  Objective: Patient is coughing but overall sounds well..  They are in no apparent distress.  Breathing is non labored.  Mood and behavior are normal.  Laboratory Data:  Recent Results (from the past 2160 hour(s))  Novel Coronavirus, NAA ([facility])     Status: Abnormal   Collection Time: 08/19/20  8:56 PM   Specimen: Nasopharyngeal Swab; Nasopharyngeal(NP) swabs in vial transport medium   Nasopharynge  Result Value Ref Range   SARS-CoV-2, NAA Detected (A) Not Detected    Comment: Patients who have a positive COVID-19 test result may now have treatment options. Treatment options are available for patients with mild to moderate symptoms and for hospitalized patients. Visit our website at [email or web address] for resources and information. This nucleic acid amplification test was developed and its performance characteristics determined by [employer]. Nucleic acid amplification tests include RT-PCR and TMA. This test has not been FDA cleared or approved. This test has been authorized by FDA under an Emergency Use Authorization (EUA). This test is only authorized for the duration of time the declaration that circumstances exist justifying the authorization of the emergency use of in vitro diagnostic tests for detection of SARS-CoV-2 virus and/or diagnosis of  COVID-19 infection under section 564(b)(1) of the Act, 21 U.S.C. 269SWN-4(O) (1), unless the authorization is terminated or revoked sooner. When diagnostic testing is negativ e, the possibility of a false negative result should be considered in the context of a patient's recent exposures and the presence of  clinical signs and symptoms consistent with COVID-19. An individual without symptoms of COVID-19 and who is not shedding SARS-CoV-2 virus would expect to have a negative (not detected) result in this assay.   SARS-COV-2, NAA 2 DAY TAT     Status: None   Collection Time: 08/19/20  8:56 PM   Nasopharynge  Result Value Ref Range   SARS-CoV-2, NAA 2 DAY TAT Performed      Assessment: 35 y.o. female with mild/moderate COVID 19 viral infection diagnosed on 08/20/2019 at high risk for progression to severe COVID 19.  Plan:  This patient is a 35 y.o. female that meets the following criteria for Emergency Use Authorization of: Molnupiravir  1. Age >18 yr 2. SARS-COV-2 positive test 3. Symptom onset < 5 days 4. Mild-to-moderate COVID disease with high risk for severe progression to hospitalization or death   I have spoken and communicated the following to the patient or parent/caregiver regarding: 1. Molnupiravir is an unapproved drug that is authorized for use under an TEFL teacher.  2. There are no adequate, approved, available products for the treatment of COVID-19 in adults who have mild-to-moderate COVID-19 and are at high risk for progressing to severe COVID-19, including hospitalization or death. 3. Other therapeutics are currently authorized. For additional information on all products authorized for treatment or prevention of COVID-19, please see https://www.graham-miller.com/.  4. There are benefits and risks of taking this treatment as outlined in the "Fact Sheet for Patients and Caregivers."  5. "Fact Sheet for Patients and Caregivers" was reviewed with patient. A hard copy will be provided to patient from pharmacy prior to the patient receiving treatment. 6. Patients should continue to self-isolate and use infection control measures (e.g., wear mask, isolate, social distance, avoid  sharing personal items, clean and disinfect "high touch" surfaces, and frequent handwashing) according to CDC guidelines.  7. The patient or parent/caregiver has the option to accept or refuse treatment. 8. [employer] has established a pregnancy surveillance program. 9. Females of childbearing potential should use a reliable method of contraception correctly and consistently, as applicable, for the duration of treatment and for 4 days after the last dose of Molnupiravir. 10. Males of reproductive potential who are sexually active with females of childbearing potential should use a reliable method of contraception correctly and consistently during treatment and for at least 3 months after the last dose. 11. Pregnancy status and risk was assessed. Patient verbalized understanding of precautions.   After reviewing above information with the patient, the patient agrees to receive molnupiravir.  Follow up instructions:    . Take prescription BID x 5 days as directed . Reach out to pharmacist for counseling on medication if desired . For concerns regarding further COVID symptoms please follow up with your PCP or urgent care . For urgent or life-threatening issues, seek care at your local emergency department  The patient was provided an opportunity to ask questions, and all were answered. The patient agreed with the plan and demonstrated an understanding of the instructions.   Script sent to [facility] and opted to pick up RX.  The patient was advised to call their PCP or seek an in-person evaluation if the  symptoms worsen or if the condition fails to improve as anticipated.   I provided 20 minutes of non face-to-face telephone visit time during this encounter, and > 50% was spent counseling as documented under my assessment & plan.  Noreene Filbert, NP 08/22/2020 /4:50 PM

## 2020-08-23 ENCOUNTER — Telehealth (HOSPITAL_COMMUNITY): Payer: Self-pay

## 2020-08-23 NOTE — Telephone Encounter (Signed)
Pt called today on day 6 of symptom onset about her Molnupiravir. Per EUA she has to be within 5 days of onset to begin therapy. I explained this to the pt and contacted her dr who also contacted the pt. The RX was reversed and put on hold.

## 2020-08-23 NOTE — Telephone Encounter (Signed)
Symptom onset Sunday 08/18/2020. Molnupiravir was Rx'd 08/22/20 and available for pick up, but was not picked up by the patient.   Spoke with patient. Tells me symptoms are improving on OTC muccinex and cold medicine. She was understanding that she was outside of window for PO antiviral. Encouraged her to continue supportive measures and follow up with her PCP as needed.   Alver Sorrow, NP

## 2020-12-16 NOTE — Progress Notes (Deleted)
  Subjective:    Trey Bebee - 35 y.o. female MRN [id number]  Date of birth: 15-Feb-1986  HPI  Tricia Danley Danker is to establish care. Patient has a PMH significant for mild persistent asthma without complication and difficulty sleeping.   Current issues and/or concerns:  ROS per HPI     Health Maintenance:  - *** Health Maintenance Due  Topic Date Due  . COVID-19 Vaccine (1) Never done  . HIV Screening  Never done  . Hepatitis C Screening  Never done  . TETANUS/TDAP  Never done  . PAP SMEAR-Modifier  Never done     Past Medical History: Patient Active Problem List   Diagnosis Date Noted  . Mild persistent asthma without complication 09/17/2019  . Difficulty sleeping 09/17/2019      Social History   reports that she has never smoked. She has never used smokeless tobacco. She reports that she does not drink alcohol.   Family History  family history includes Bipolar disorder in her mother; Diabetes in her mother; Heart failure in her father; Hypertension in her mother.   Medications: reviewed and updated   Objective:   Physical Exam There were no vitals taken for this visit. Physical Exam      Assessment & Plan:         Patient was given clear instructions to go to Emergency Department or return to medical center if symptoms don't improve, worsen, or new problems develop.The patient verbalized understanding.  I discussed the assessment and treatment plan with the patient. The patient was provided an opportunity to ask questions and all were answered. The patient agreed with the plan and demonstrated an understanding of the instructions.   The patient was advised to call back or seek an in-person evaluation if the symptoms worsen or if the condition fails to improve as anticipated.    Ricky Stabs, NP 12/16/2020, 7:45 AM Primary Care at [facility]

## 2020-12-17 ENCOUNTER — Ambulatory Visit: Payer: BC Managed Care – PPO | Admitting: Family

## 2020-12-22 ENCOUNTER — Ambulatory Visit: Payer: Self-pay

## 2021-02-18 NOTE — Progress Notes (Signed)
Virtual Visit via Telephone Note  I connected with Nichole Morris, on 02/19/2021 at 11:21 AM by telephone due to the COVID-19 pandemic and verified that I am speaking with the correct person using two identifiers.  Due to current restrictions/limitations of in-office visits due to the COVID-19 pandemic, this scheduled clinical appointment was converted to a telehealth visit.   Consent: I discussed the limitations, risks, security and privacy concerns of performing an evaluation and management service by telephone and the availability of in person appointments. I also discussed with the patient that there may be a patient responsible charge related to this service. The patient expressed understanding and agreed to proceed.   Location of Patient: Home  Location of Provider: [facility] at [facility]   Persons participating in Telemedicine visit: Nichole Morris Ricky Stabs, NP [name], CMA   History of Present Illness: Nichole Morris is a 35 year-old female who presents to establish care.   Current issues and/or concerns: Would like to discuss weight management options. Reports gaining about 40 pounds over the last few years.   Having insomnia about 3 to 4 years. Recently worsening. Can go 3 or 4 days with only 1 hour of sleep sometimes. Not sure if she snores. As a child sometimes awakened during the night gasping for air, this has happened on occasion as an adult.Tried over-the-counter melatonin and muscle relaxer without success. Tried Trazodone in the past and did not help. Making sure to turn phone off at night. May try to watch television until able to fall asleep. Not sure if this is related to anxiety depression. Denies thoughts of self-harm, suicidal ideations, and homicidal ideations. Does have history of the same since adolescence. Was taking Wellbutrin which helped.     Past Medical History:  Diagnosis Date   Asthma    Allergies   Allergen Reactions   Shellfish Allergy Anaphylaxis, Shortness Of Breath and Swelling   Iodine Rash    Current Outpatient Medications on File Prior to Visit  Medication Sig Dispense Refill   albuterol (VENTOLIN HFA) 108 (90 Base) MCG/ACT inhaler Inhale 2 puffs into the lungs every 6 (six) hours as needed for wheezing or shortness of breath. 18 g 11   benzonatate (TESSALON) 100 MG capsule Take 1 capsule (100 mg total) by mouth every 8 (eight) hours. 21 capsule 0   Cetirizine HCl 10 MG CAPS Take 1 capsule (10 mg total) by mouth daily for 10 days. 10 capsule 0   Fluticasone-Salmeterol (ADVAIR) 100-50 MCG/DOSE AEPB Inhale 1 puff into the lungs 2 (two) times daily. 1 each 3   ibuprofen (ADVIL) 600 MG tablet Take 1 tablet (600 mg total) by mouth every 6 (six) hours as needed. 30 tablet 0   Molnupiravir 200 MG CAPS TAKE 4 CAPSULES (800 MG TOTAL) BY MOUTH 2 TIMES DAILY FOR 5 DAYS. 40 capsule 0   ondansetron (ZOFRAN ODT) 4 MG disintegrating tablet Take 1 tablet (4 mg total) by mouth every 8 (eight) hours as needed for nausea or vomiting. 20 tablet 0   traZODone (DESYREL) 50 MG tablet Take 0.5-1 tablets (25-50 mg total) by mouth at bedtime as needed for sleep. 30 tablet 3   No current facility-administered medications on file prior to visit.    Observations/Objective: Alert and oriented x 3. Not in acute distress. Physical examination not completed as this is a telemedicine visit.  Assessment and Plan: 1. Encounter to establish care: - Patient presents today to establish care.  - Return for  annual physical examination, labs, and health maintenance. Arrive fasting meaning having no food for at least 8 hours prior to appointment. You may have only water or black coffee. Please take scheduled medications as normal.  2. Encounter for weight management: - Follow-up with primary provider for in-person appointment to discuss more. Patient agreeable.   3. Insomnia, unspecified type: - Follow-up with  primary provider for in-person appointment to discuss more. Patient agreeable.    Follow Up Instructions: Return for annual physical exam.   Patient was given clear instructions to go to Emergency Department or return to medical center if symptoms don't improve, worsen, or new problems develop.The patient verbalized understanding.  I discussed the assessment and treatment plan with the patient. The patient was provided an opportunity to ask questions and all were answered. The patient agreed with the plan and demonstrated an understanding of the instructions.   The patient was advised to call back or seek an in-person evaluation if the symptoms worsen or if the condition fails to improve as anticipated.    I provided 10 minutes total of non-face-to-face time during this encounter.   Rema Fendt, NP  [facility] [place], [place] [phone] 02/19/2021, 11:21 AM

## 2021-02-19 ENCOUNTER — Other Ambulatory Visit: Payer: Self-pay

## 2021-02-19 ENCOUNTER — Telehealth (INDEPENDENT_AMBULATORY_CARE_PROVIDER_SITE_OTHER): Payer: BC Managed Care – PPO | Admitting: Family

## 2021-02-19 DIAGNOSIS — Z7689 Persons encountering health services in other specified circumstances: Secondary | ICD-10-CM | POA: Diagnosis not present

## 2021-02-19 DIAGNOSIS — G4700 Insomnia, unspecified: Secondary | ICD-10-CM

## 2021-02-19 NOTE — Progress Notes (Signed)
Pt presents for telemedicine visit to establish care °

## 2021-04-01 ENCOUNTER — Ambulatory Visit: Payer: BC Managed Care – PPO | Admitting: Family

## 2021-04-01 NOTE — Progress Notes (Signed)
Patient ID: Nichole Morris, female    DOB: March 29, 1986  MRN: [id number]  CC: Anxiety Depression  Subjective: Nichole Morris is a 35 y.o. female who presents for anxiety depression.   Her concerns today include:  Anxiety depression ongoing since 35 years old. Primarily related to single parenting and work life balance. Affecting sleep and more tired than normal. Reports eating more at night time and as a result gaining weight. History of postpartum depression. Reports mother has bipolar diagnosis and thinks maybe history of the same in maternal uncle.   Took Wellbutrin in the past for depression and seemed to help. Would like to begin weight management medication.   Would like referral to Psychiatry.  Denies thoughts of self-harm, suicidal ideations, and homicidal ideations.   Depression screen [facility] 2/9 04/02/2021 02/19/2021 08/16/2019  Decreased Interest 0 0 0  Down, Depressed, Hopeless 1 0 0  PHQ - 2 Score 1 0 0  Altered sleeping 3 0 -  Tired, decreased energy 3 0 -  Change in appetite 3 0 -  Feeling bad or failure about yourself  2 0 -  Trouble concentrating 1 0 -  Moving slowly or fidgety/restless 0 0 -  Suicidal thoughts 0 0 -  PHQ-9 Score 13 0 -  Difficult doing work/chores - Not difficult at all -    Patient Active Problem List   Diagnosis Date Noted   Mild persistent asthma without complication 09/17/2019   Difficulty sleeping 09/17/2019     No current outpatient medications on file prior to visit.   No current facility-administered medications on file prior to visit.    Allergies  Allergen Reactions   Shellfish Allergy Anaphylaxis, Shortness Of Breath and Swelling   Iodine Rash    Social History   Socioeconomic History   Marital status: Single    Spouse name: Not on file   Number of children: Not on file   Years of education: Not on file   Highest education level: Not on file  Occupational History   Not on file  Tobacco Use   Smoking status:  Never   Smokeless tobacco: Never  Vaping Use   Vaping Use: Never used  Substance and Sexual Activity   Alcohol use: No   Drug use: Not on file   Sexual activity: Yes    Birth control/protection: Condom  Other Topics Concern   Not on file  Social History Narrative   Not on file   Social Determinants of Health   Financial Resource Strain: Not on file  Food Insecurity: Not on file  Transportation Needs: Not on file  Physical Activity: Not on file  Stress: Not on file  Social Connections: Not on file  Intimate Partner Violence: Not on file    Family History  Problem Relation Age of Onset   Bipolar disorder Mother    Diabetes Mother    Hypertension Mother    Heart failure Father     Past Surgical History:  Procedure Laterality Date   CESAREAN SECTION      ROS: Review of Systems Negative except as stated above  PHYSICAL EXAM: BP 105/71 (BP Location: Right Arm, Patient Position: Sitting, Cuff Size: Large)   Pulse 92   Temp 98.4 F (36.9 C) (Oral)   Resp 16   Ht 5\' 2"  (1.575 m)   Wt 200 lb 6.4 oz (90.9 kg)   SpO2 98%   BMI 36.65 kg/m   Physical Exam HENT:     Head: Normocephalic and  atraumatic.  Eyes:     Extraocular Movements: Extraocular movements intact.     Conjunctiva/sclera: Conjunctivae normal.     Pupils: Pupils are equal, round, and reactive to light.  Cardiovascular:     Rate and Rhythm: Normal rate and regular rhythm.     Pulses: Normal pulses.     Heart sounds: Normal heart sounds.  Pulmonary:     Effort: Pulmonary effort is normal.     Breath sounds: Normal breath sounds.  Musculoskeletal:     Cervical back: Normal range of motion and neck supple.  Neurological:     General: No focal deficit present.     Mental Status: She is alert and oriented to person, place, and time.  Psychiatric:        Attention and Perception: Attention normal.        Mood and Affect: Affect is tearful.        Speech: Speech normal.        Behavior: Behavior  normal. Behavior is cooperative.        Thought Content: Thought content normal.        Cognition and Memory: Cognition normal.        Judgment: Judgment normal.   ASSESSMENT AND PLAN: 1. Anxiety and depression: - Patient denies thoughts of self-harm, suicidal ideations, and homicidal ideations.  - Begin Sertraline as prescribed.  Do not drink alcohol or use illicit substances with with this medication.  Avoid driving or hazardous activity until you know how this medication will affect you. Your reactions could be impaired. Dizziness or fainting can cause falls, accidents, or severe injuries. Common side effects include drowsiness, nausea, constipation, loss of appetite, dry mouth, increased sweating. Call your provider if you have pounding heartbeats or fluttering in your chest, a light-headed feeling like you may pass out, easy bruising/unusal bleeding, vision change, difficult or painful urination, impotence/sexual problems, liver problems (right-sided upper stomach pain, itching, dark urine, yellowing of skin or eyes/jaundice, low levels of sodium in the body (headache, confusion, slurred speech, severe weakness, vomiting, loss of coordination, feeling unsteady), or manic episodes (racing thoughts, increased energy, decreased need for sleep, risk-taking behavior, being agitated, talkative) Seek medical attention immediately if you have symptoms of serotonin syndrome such as agitation, hallucinations, fever, sweating, shivering, fast heart rate, muscle stiffness, twitching, loss of coordination, nausea, vomiting, or diarrhea Report any new or worsening symptoms to your provider, such as but not limited to: mood or behavior changes, anxiety, panic attacks, trouble sleeping, or if you feel impulsive, irritable, agitated, hostile, aggressive, restless, hyperactive (mentally or physically), more depressed, or have thoughts about suicide or hurting yourself - Referral to Psychiatry for further  evaluation and management. - Follow-up with primary provider in 4 weeks or sooner if needed.  - Ambulatory referral to Psychiatry - sertraline (ZOLOFT) 25 MG tablet; Take 1 tablet (25 mg total) by mouth daily.  Dispense: 60 tablet; Refill: 0  2. Encounter for weight management: 3. Class 2 obesity without serious comorbidity with body mass index (BMI) of 36.0 to 36.9 in adult, unspecified obesity type: -  Begin Phentermine as prescribed. Encouraged to monitor blood pressures. Counseled to let me know if she has any palpitations. Patient verbalized understanding. - I did check the [facility] prescription drug database and found no frequent prescribers of opiates or evidence of aberrant behavior. - Counseled on low-sodium DASH diet and 150 minutes of moderate intensity exercise per week as tolerated to assist with weight management.  - Follow-up with  primary provider within 4 weeks or sooner if needed. - phentermine 15 MG capsule; Take 1 capsule (15 mg total) by mouth every morning.  Dispense: 30 capsule; Refill: 0   Patient was given the opportunity to ask questions.  Patient verbalized understanding of the plan and was able to repeat key elements of the plan. Patient was given clear instructions to go to Emergency Department or return to medical center if symptoms don't improve, worsen, or new problems develop.The patient verbalized understanding.   Orders Placed This Encounter  Procedures   Ambulatory referral to Psychiatry     Requested Prescriptions   Signed Prescriptions Disp Refills   sertraline (ZOLOFT) 25 MG tablet 60 tablet 0    Sig: Take 1 tablet (25 mg total) by mouth daily.   phentermine 15 MG capsule 30 capsule 0    Sig: Take 1 capsule (15 mg total) by mouth every morning.    Return in about 4 weeks (around 04/30/2021) for Follow-Up or next available weight management and anxiety depression .  Rema Fendt, NP

## 2021-04-02 ENCOUNTER — Encounter: Payer: Self-pay | Admitting: Family

## 2021-04-02 ENCOUNTER — Ambulatory Visit (INDEPENDENT_AMBULATORY_CARE_PROVIDER_SITE_OTHER): Payer: BC Managed Care – PPO | Admitting: Family

## 2021-04-02 ENCOUNTER — Other Ambulatory Visit: Payer: Self-pay

## 2021-04-02 VITALS — BP 105/71 | HR 92 | Temp 98.4°F | Resp 16 | Ht 62.0 in | Wt 200.4 lb

## 2021-04-02 DIAGNOSIS — Z6836 Body mass index (BMI) 36.0-36.9, adult: Secondary | ICD-10-CM | POA: Diagnosis not present

## 2021-04-02 DIAGNOSIS — F32A Depression, unspecified: Secondary | ICD-10-CM | POA: Diagnosis not present

## 2021-04-02 DIAGNOSIS — F419 Anxiety disorder, unspecified: Secondary | ICD-10-CM | POA: Diagnosis not present

## 2021-04-02 DIAGNOSIS — E669 Obesity, unspecified: Secondary | ICD-10-CM | POA: Diagnosis not present

## 2021-04-02 DIAGNOSIS — Z7689 Persons encountering health services in other specified circumstances: Secondary | ICD-10-CM

## 2021-04-02 MED ORDER — SERTRALINE HCL 25 MG PO TABS: 25.0000 mg | ORAL_TABLET | Freq: Every day | ORAL | 0 refills | Status: DC

## 2021-04-02 MED ORDER — PHENTERMINE HCL 15 MG PO CAPS: 15.0000 mg | ORAL_CAPSULE | ORAL | 0 refills | Status: DC

## 2021-04-02 NOTE — Progress Notes (Signed)
Patient would like to talk about having fatigue.

## 2021-04-24 NOTE — Progress Notes (Addendum)
Patient ID: Nichole Morris, female    DOB: 1986/06/07  MRN: [id number]  CC: Annual Physical Exam  Subjective: Nichole Morris is a 35 y.o. female who presents for annual physical exam.   Her concerns today include:   ANXIETY DEPRESSION FOLLOW-UP: 04/02/2021: - Begin Sertraline as prescribed.   04/28/2021: Reports she discontinued taking Zoloft and felt that it was not working as well and was causing nausea. Has not heard from Psychiatry as of present.   2. CHRONIC BACK PAIN: Low back pain since delivery of last child. Taking Ibuprofen for pain management. History of epidural placement.   3. LEFT BREAST PAIN: Ongoing for at least 1 year. Worse with menses.   4. WEIGHT MANAGEMENT FOLLOW-UP: Unable to pick up prescription for Phentermine since last visit.   Patient Active Problem List   Diagnosis Date Noted   Mild persistent asthma without complication 09/17/2019   Difficulty sleeping 09/17/2019     Current Outpatient Medications on File Prior to Visit  Medication Sig Dispense Refill   phentermine 15 MG capsule Take 1 capsule (15 mg total) by mouth every morning. 30 capsule 0   No current facility-administered medications on file prior to visit.    Allergies  Allergen Reactions   Shellfish Allergy Anaphylaxis, Shortness Of Breath and Swelling   Iodine Rash    Social History   Socioeconomic History   Marital status: Single    Spouse name: Not on file   Number of children: Not on file   Years of education: Not on file   Highest education level: Not on file  Occupational History   Not on file  Tobacco Use   Smoking status: Never   Smokeless tobacco: Never  Vaping Use   Vaping Use: Never used  Substance and Sexual Activity   Alcohol use: No   Drug use: Not on file   Sexual activity: Yes    Birth control/protection: Condom  Other Topics Concern   Not on file  Social History Narrative   Not on file   Social Determinants of Health   Financial  Resource Strain: Not on file  Food Insecurity: Not on file  Transportation Needs: Not on file  Physical Activity: Not on file  Stress: Not on file  Social Connections: Not on file  Intimate Partner Violence: Not on file    Family History  Problem Relation Age of Onset   Bipolar disorder Mother    Diabetes Mother    Hypertension Mother    Heart failure Father     Past Surgical History:  Procedure Laterality Date   CESAREAN SECTION      ROS: Review of Systems Negative except as stated above  PHYSICAL EXAM: BP 96/64 (BP Location: Left Arm, Patient Position: Sitting, Cuff Size: Normal)   Pulse 81   Temp 98.1 F (36.7 C)   Resp 16   Ht 5' 2.01" (1.575 m)   Wt 197 lb 3.2 oz (89.4 kg)   SpO2 98%   BMI 36.06 kg/m   Physical Exam HENT:     Head: Normocephalic and atraumatic.     Right Ear: Tympanic membrane, ear canal and external ear normal.     Left Ear: Tympanic membrane, ear canal and external ear normal.  Eyes:     Extraocular Movements: Extraocular movements intact.     Conjunctiva/sclera: Conjunctivae normal.     Pupils: Pupils are equal, round, and reactive to light.  Cardiovascular:     Rate and Rhythm: Normal rate and  regular rhythm.     Pulses: Normal pulses.     Heart sounds: Normal heart sounds.  Pulmonary:     Effort: Pulmonary effort is normal.     Breath sounds: Normal breath sounds.  Chest:     Comments: Patient declined exam. Abdominal:     General: Bowel sounds are normal.     Palpations: Abdomen is soft.  Genitourinary:    Comments: Patient declined exam. Musculoskeletal:        General: Normal range of motion.     Cervical back: Normal range of motion and neck supple.     Lumbar back: Tenderness present.  Skin:    General: Skin is warm and dry.     Capillary Refill: Capillary refill takes less than 2 seconds.  Neurological:     General: No focal deficit present.     Mental Status: She is alert and oriented to person, place, and time.   Psychiatric:        Mood and Affect: Mood normal.        Behavior: Behavior normal.     ASSESSMENT AND PLAN: 1. Annual physical exam: - Counseled on 150 minutes of exercise per week as tolerated, healthy eating (including decreased daily intake of saturated fats, cholesterol, added sugars, sodium), STI prevention, and routine healthcare maintenance.  2. Screening for metabolic disorder: - BBC48+GQBV to check kidney function, liver function, and electrolyte balance.  - CMP14+EGFR  3. Screening for deficiency anemia: - CBC to screen for anemia. - CBC  4. Diabetes mellitus screening: - Hemoglobin A1c to screen for pre-diabetes/diabetes. - Hemoglobin A1c  5. Screening cholesterol level: - Lipid panel to screen for high cholesterol.  - Lipid panel  6. Thyroid disorder screen: - TSH to check thyroid function.  - TSH  7. Need for hepatitis C screening test: - Hepatitis C antibody to screen for hepatitis C.  - Hepatitis C Antibody  8. Pap smear for cervical cancer screening: 9. Routine screening for STI (sexually transmitted infection): - Patient declined. Plans to follow-up with [facility].   10. Encounter for screening for HIV: - HIV antibody to screen for human immunodeficiency virus.  - HIV antibody (with reflex)  11. Anxiety and depression: - Patient denies thoughts of self-harm, suicidal ideations, and homicidal ideations.  - Sertraline discontinued per patient preference.  - Begin Escitalopram as prescribed.  - Ondansetron as prescribed for possible side effect of nausea.  - I have reached out to referral coordinator, Maren Reamer, on status of Psychiatry referral.  - Follow-up with primary provider in 4 weeks or sooner if needed.  - escitalopram (LEXAPRO) 10 MG tablet; Take 1 tablet (10 mg total) by mouth daily.  Dispense: 30 tablet; Refill: 0 - ondansetron (ZOFRAN) 4 MG tablet; Take 1 tablet (4 mg total) by mouth every 8 (eight) hours as needed for nausea or  vomiting.  Dispense: 20 tablet; Refill: 0  12. Chronic midline low back pain, unspecified whether sciatica present: - Diagnostic lumbar spine for further evaluation.  - Follow-up with primary provider as scheduled.  - DG Lumbar Spine Complete; Future  13. Breast pain, left: - Ultrasound breast left for further evaluation. - Follow-up with primary provider as scheduled.  - US BREAST COMPLETE UNI LEFT INC AXILLA; Future  14. Encounter for weight management: 15. Class 2 obesity without serious comorbidity with body mass index (BMI) of 36.0 to 36.9 in adult, unspecified obesity type: - Begin Phentermine as prescribed.  - Follow-up with primary provider in 4  weeks or sooner if needed.  - phentermine 15 MG capsule; Take 1 capsule (15 mg total) by mouth every morning.  Dispense: 30 capsule; Refill: 0  16. Need for influenza vaccination: - Administered today in office.  - Flu Vaccine QUAD 58mo+IM (Fluarix, Fluzone & Alfiuria Quad PF)  17. Need for Tdap vaccination: - Administered today in office.  - Tdap vaccine greater than or equal to 7yo IM   Patient was given the opportunity to ask questions.  Patient verbalized understanding of the plan and was able to repeat key elements of the plan. Patient was given clear instructions to go to Emergency Department or return to medical center if symptoms don't improve, worsen, or new problems develop.The patient verbalized understanding.   Orders Placed This Encounter  Procedures   DG Lumbar Spine Complete   US BREAST COMPLETE UNI LEFT INC AXILLA   Tdap vaccine greater than or equal to 7yo IM   Flu Vaccine QUAD 35mo+IM (Fluarix, Fluzone & Alfiuria Quad PF)   HIV antibody (with reflex)   Hepatitis C Antibody   CBC   Lipid panel   TSH   CMP14+EGFR   Hemoglobin A1c     Requested Prescriptions   Signed Prescriptions Disp Refills   escitalopram (LEXAPRO) 10 MG tablet 30 tablet 0    Sig: Take 1 tablet (10 mg total) by mouth daily.    ondansetron (ZOFRAN) 4 MG tablet 20 tablet 0    Sig: Take 1 tablet (4 mg total) by mouth every 8 (eight) hours as needed for nausea or vomiting.    Return in about 1 year (around 04/28/2022) for Physical per patient preference and 4 weeks anxiety/depression .  Camillia Herter, NP

## 2021-04-25 ENCOUNTER — Other Ambulatory Visit: Payer: Self-pay

## 2021-04-28 ENCOUNTER — Ambulatory Visit (INDEPENDENT_AMBULATORY_CARE_PROVIDER_SITE_OTHER): Payer: BC Managed Care – PPO

## 2021-04-28 ENCOUNTER — Ambulatory Visit (INDEPENDENT_AMBULATORY_CARE_PROVIDER_SITE_OTHER): Payer: BC Managed Care – PPO | Admitting: Family

## 2021-04-28 ENCOUNTER — Other Ambulatory Visit: Payer: Self-pay

## 2021-04-28 VITALS — BP 96/64 | HR 81 | Temp 98.1°F | Resp 16 | Ht 62.01 in | Wt 197.2 lb

## 2021-04-28 DIAGNOSIS — Z131 Encounter for screening for diabetes mellitus: Secondary | ICD-10-CM | POA: Diagnosis not present

## 2021-04-28 DIAGNOSIS — Z23 Encounter for immunization: Secondary | ICD-10-CM

## 2021-04-28 DIAGNOSIS — G8929 Other chronic pain: Secondary | ICD-10-CM | POA: Diagnosis not present

## 2021-04-28 DIAGNOSIS — Z114 Encounter for screening for human immunodeficiency virus [HIV]: Secondary | ICD-10-CM | POA: Diagnosis not present

## 2021-04-28 DIAGNOSIS — Z6836 Body mass index (BMI) 36.0-36.9, adult: Secondary | ICD-10-CM

## 2021-04-28 DIAGNOSIS — Z13220 Encounter for screening for lipoid disorders: Secondary | ICD-10-CM | POA: Diagnosis not present

## 2021-04-28 DIAGNOSIS — Z0000 Encounter for general adult medical examination without abnormal findings: Secondary | ICD-10-CM

## 2021-04-28 DIAGNOSIS — N644 Mastodynia: Secondary | ICD-10-CM

## 2021-04-28 DIAGNOSIS — Z130 Encounter for screening for diseases of the blood and blood-forming organs and certain disorders involving the immune mechanism: Secondary | ICD-10-CM

## 2021-04-28 DIAGNOSIS — F419 Anxiety disorder, unspecified: Secondary | ICD-10-CM

## 2021-04-28 DIAGNOSIS — Z1159 Encounter for screening for other viral diseases: Secondary | ICD-10-CM

## 2021-04-28 DIAGNOSIS — E669 Obesity, unspecified: Secondary | ICD-10-CM

## 2021-04-28 DIAGNOSIS — M545 Low back pain, unspecified: Secondary | ICD-10-CM

## 2021-04-28 DIAGNOSIS — Z1329 Encounter for screening for other suspected endocrine disorder: Secondary | ICD-10-CM

## 2021-04-28 DIAGNOSIS — F32A Depression, unspecified: Secondary | ICD-10-CM

## 2021-04-28 DIAGNOSIS — Z113 Encounter for screening for infections with a predominantly sexual mode of transmission: Secondary | ICD-10-CM

## 2021-04-28 DIAGNOSIS — Z13228 Encounter for screening for other metabolic disorders: Secondary | ICD-10-CM

## 2021-04-28 DIAGNOSIS — Z7689 Persons encountering health services in other specified circumstances: Secondary | ICD-10-CM

## 2021-04-28 DIAGNOSIS — Z124 Encounter for screening for malignant neoplasm of cervix: Secondary | ICD-10-CM

## 2021-04-28 MED ORDER — PHENTERMINE HCL 15 MG PO CAPS
15.0000 mg | ORAL_CAPSULE | ORAL | 0 refills | Status: AC
Start: 2021-04-28 — End: 2021-05-28

## 2021-04-28 MED ORDER — ONDANSETRON HCL 4 MG PO TABS: 4.0000 mg | ORAL_TABLET | Freq: Three times a day (TID) | ORAL | 0 refills | Status: AC | PRN

## 2021-04-28 MED ORDER — ESCITALOPRAM OXALATE 10 MG PO TABS: 10.0000 mg | ORAL_TABLET | Freq: Every day | ORAL | 0 refills | Status: AC

## 2021-04-28 NOTE — Patient Instructions (Signed)
Preventive Care 21-36 Years Old, Female Preventive care refers to lifestyle choices and visits with your health care provider that can promote health and wellness. This includes: A yearly physical exam. This is also called an annual wellness visit. Regular dental and eye exams. Immunizations. Screening for certain conditions. Healthy lifestyle choices, such as: Eating a healthy diet. Getting regular exercise. Not using drugs or products that contain nicotine and tobacco. Limiting alcohol use. What can I expect for my preventive care visit? Physical exam Your health care provider may check your: Height and weight. These may be used to calculate your BMI (body mass index). BMI is a measurement that tells if you are at a healthy weight. Heart rate and blood pressure. Body temperature. Skin for abnormal spots. Counseling Your health care provider may ask you questions about your: Past medical problems. Family's medical history. Alcohol, tobacco, and drug use. Emotional well-being. Home life and relationship well-being. Sexual activity. Diet, exercise, and sleep habits. Work and work environment. Access to firearms. Method of birth control. Menstrual cycle. Pregnancy history. What immunizations do I need? Vaccines are usually given at various ages, according to a schedule. Your health care provider will recommend vaccines for you based on your age, medical history, and lifestyle or other factors, such as travel or where you work. What tests do I need? Blood tests Lipid and cholesterol levels. These may be checked every 5 years starting at age 20. Hepatitis C test. Hepatitis B test. Screening Diabetes screening. This is done by checking your blood sugar (glucose) after you have not eaten for a while (fasting). STD (sexually transmitted disease) testing, if you are at risk. BRCA-related cancer screening. This may be done if you have a family history of breast, ovarian, tubal, or  peritoneal cancers. Pelvic exam and Pap test. This may be done every 3 years starting at age 21. Starting at age 30, this may be done every 5 years if you have a Pap test in combination with an HPV test. Talk with your health care provider about your test results, treatment options, and if necessary, the need for more tests. Follow these instructions at home: Eating and drinking  Eat a healthy diet that includes fresh fruits and vegetables, whole grains, lean protein, and low-fat dairy products. Take vitamin and mineral supplements as recommended by your health care provider. Do not drink alcohol if: Your health care provider tells you not to drink. You are pregnant, may be pregnant, or are planning to become pregnant. If you drink alcohol: Limit how much you have to 0-1 drink a day. Be aware of how much alcohol is in your drink. In the [place] one drink equals one 12 oz bottle of beer (355 mL), one 5 oz glass of wine (148 mL), or one 1 oz glass of hard liquor (44 mL). Lifestyle Take daily care of your teeth and gums. Brush your teeth every morning and night with fluoride toothpaste. Floss one time each day. Stay active. Exercise for at least 30 minutes 5 or more days each week. Do not use any products that contain nicotine or tobacco, such as cigarettes, e-cigarettes, and chewing tobacco. If you need help quitting, ask your health care provider. Do not use drugs. If you are sexually active, practice safe sex. Use a condom or other form of protection to prevent STIs (sexually transmitted infections). If you do not wish to become pregnant, use a form of birth control. If you plan to become pregnant, see your health care provider   for a prepregnancy visit. Find healthy ways to cope with stress, such as: Meditation, yoga, or listening to music. Journaling. Talking to a trusted person. Spending time with friends and family. Safety Always wear your seat belt while driving or riding in a  vehicle. Do not drive: If you have been drinking alcohol. Do not ride with someone who has been drinking. When you are tired or distracted. While texting. Wear a helmet and other protective equipment during sports activities. If you have firearms in your house, make sure you follow all gun safety procedures. Seek help if you have been physically or sexually abused. What's next? Go to your health care provider once a year for an annual wellness visit. Ask your health care provider how often you should have your eyes and teeth checked. Stay up to date on all vaccines. This information is not intended to replace advice given to you by your health care provider. Make sure you discuss any questions you have with your health care provider. Document Revised: 10/04/2020 Document Reviewed: 04/07/2018 Elsevier Patient Education  2022 [employer] Inc.  

## 2021-04-28 NOTE — Progress Notes (Signed)
Pt presents for annual physical exam declines pap request referral to gyn..pt reports she has stopped taking Zoloft  Experiencing low back pain for approx 1 month

## 2021-04-29 ENCOUNTER — Other Ambulatory Visit: Payer: Self-pay | Admitting: Family

## 2021-04-29 DIAGNOSIS — M5137 Other intervertebral disc degeneration, lumbosacral region: Secondary | ICD-10-CM | POA: Insufficient documentation

## 2021-04-29 DIAGNOSIS — R7303 Prediabetes: Secondary | ICD-10-CM | POA: Insufficient documentation

## 2021-04-29 LAB — LIPID PANEL
Chol/HDL Ratio: 4.3 ratio (ref 0.0–4.4)
Cholesterol, Total: 169 mg/dL (ref 100–199)
HDL: 39 mg/dL — ABNORMAL LOW (ref >39)
LDL Chol Calc (NIH): 109 mg/dL — ABNORMAL HIGH (ref 0–99)
Triglycerides: 117 mg/dL (ref 0–149)
VLDL Cholesterol Cal: 21 mg/dL (ref 5–40)

## 2021-04-29 LAB — CMP14+EGFR
ALT: 11 IU/L (ref 0–32)
AST: 13 IU/L (ref 0–40)
Albumin/Globulin Ratio: 1.8 (ref 1.2–2.2)
Albumin: 4.7 g/dL (ref 3.8–4.8)
Alkaline Phosphatase: 130 IU/L — ABNORMAL HIGH (ref 44–121)
BUN/Creatinine Ratio: 14 (ref 9–23)
BUN: 10 mg/dL (ref 6–20)
Bilirubin Total: 0.3 mg/dL (ref 0.0–1.2)
CO2: 21 mmol/L (ref 20–29)
Calcium: 9.7 mg/dL (ref 8.7–10.2)
Chloride: 104 mmol/L (ref 96–106)
Creatinine, Ser: 0.7 mg/dL (ref 0.57–1.00)
Globulin, Total: 2.6 g/dL (ref 1.5–4.5)
Glucose: 109 mg/dL — ABNORMAL HIGH (ref 65–99)
Potassium: 4.3 mmol/L (ref 3.5–5.2)
Sodium: 139 mmol/L (ref 134–144)
Total Protein: 7.3 g/dL (ref 6.0–8.5)
eGFR: 115 mL/min/{1.73_m2} (ref >59)

## 2021-04-29 LAB — CBC
Hematocrit: 41.2 % (ref 34.0–46.6)
Hemoglobin: 13.5 g/dL (ref 11.1–15.9)
MCH: 27.2 pg (ref 26.6–33.0)
MCHC: 32.8 g/dL (ref 31.5–35.7)
MCV: 83 fL (ref 79–97)
Platelets: 267 10*3/uL (ref 150–450)
RBC: 4.96 x10E6/uL (ref 3.77–5.28)
RDW: 13.1 % (ref 11.7–15.4)
WBC: 7.4 10*3/uL (ref 3.4–10.8)

## 2021-04-29 LAB — HEMOGLOBIN A1C
Est. average glucose Bld gHb Est-mCnc: 128 mg/dL
Hgb A1c MFr Bld: 6.1 % — ABNORMAL HIGH (ref 4.8–5.6)

## 2021-04-29 LAB — HEPATITIS C ANTIBODY: Hep C Virus Ab: 0.3 s/co ratio (ref 0.0–0.9)

## 2021-04-29 LAB — TSH: TSH: 0.872 u[IU]/mL (ref 0.450–4.500)

## 2021-04-29 LAB — HIV ANTIBODY (ROUTINE TESTING W REFLEX): HIV Screen 4th Generation wRfx: NONREACTIVE

## 2021-04-29 NOTE — Progress Notes (Signed)
Mild arthritis lower back. Referral to Orthopedics for further evaluation and management. Their office should call patient within 2 weeks with appointment details.

## 2021-04-29 NOTE — Progress Notes (Signed)
Kidney function normal.   Liver function normal.   Thyroid function normal.   No anemia.   Hepatitis C negative.   HIV negative.   Cholesterol higher than expected. High cholesterol may increase risk of heart attack and/or stroke. Consider eating more fruits, vegetables, and lean baked meats such as chicken or fish. Moderate intensity exercise at least 150 minutes as tolerated per week may help as well. However, your risk of heart attack/stroke in ten years is average risk so does not need to start a medication at the moment. Encouraged to recheck in 3 to 6 months.  Hemoglobin A1c is consistent with pre-diabetes. Practice healthy eating habits of fresh fruit and vegetables, lean baked meats such as chicken, fish, and [place]; limit breads, rice, pastas, and desserts; practice regular aerobic exercise (at least 150 minutes a week as tolerated). No medication needed at the moment. Encouraged to recheck in 6 months.

## 2021-05-01 ENCOUNTER — Encounter: Payer: Self-pay | Admitting: Family

## 2021-05-14 ENCOUNTER — Ambulatory Visit
Admission: RE | Admit: 2021-05-14 | Discharge: 2021-05-14 | Disposition: A | Discharge status: Home / Self Care | Payer: BC Managed Care – PPO | Source: Ambulatory Visit | Attending: Urgent Care | Admitting: Urgent Care

## 2021-05-14 ENCOUNTER — Ambulatory Visit: Payer: BC Managed Care – PPO | Admitting: Surgery

## 2021-05-14 ENCOUNTER — Other Ambulatory Visit: Payer: Self-pay

## 2021-05-14 VITALS — BP 107/67 | HR 83 | Temp 98.2°F | Resp 18

## 2021-05-14 DIAGNOSIS — J4530 Mild persistent asthma, uncomplicated: Secondary | ICD-10-CM

## 2021-05-14 DIAGNOSIS — J069 Acute upper respiratory infection, unspecified: Secondary | ICD-10-CM

## 2021-05-14 DIAGNOSIS — R062 Wheezing: Secondary | ICD-10-CM | POA: Diagnosis not present

## 2021-05-14 MED ORDER — ALBUTEROL SULFATE HFA 108 (90 BASE) MCG/ACT IN AERS: 1.0000 | INHALATION_SPRAY | Freq: Four times a day (QID) | RESPIRATORY_TRACT | 0 refills | Status: AC | PRN

## 2021-05-14 MED ORDER — PREDNISONE 20 MG PO TABS: ORAL_TABLET | ORAL | 0 refills | Status: AC

## 2021-05-14 NOTE — ED Provider Notes (Signed)
[facility]   MRN: [id number] DOB: [birth year]  Subjective:   Nichole Morris is a 35 y.o. female presenting for 2-day history of acute onset recurrent shortness of breath, wheezing, coughing.  Patient has a history of asthma, needs refill on her albuterol.  She would also like a COVID test.  Patient is not a smoker.  She has 1 sick contact, her son who is also here for testing.  No fevers, body aches, chest pain.  No current facility-administered medications for this encounter.  Current Outpatient Medications:    escitalopram (LEXAPRO) 10 MG tablet, Take 1 tablet (10 mg total) by mouth daily., Disp: 30 tablet, Rfl: 0   ondansetron (ZOFRAN) 4 MG tablet, Take 1 tablet (4 mg total) by mouth every 8 (eight) hours as needed for nausea or vomiting., Disp: 20 tablet, Rfl: 0   phentermine 15 MG capsule, Take 1 capsule (15 mg total) by mouth every morning., Disp: 30 capsule, Rfl: 0   Allergies  Allergen Reactions   Shellfish Allergy Anaphylaxis, Shortness Of Breath and Swelling   Iodine Rash    Past Medical History:  Diagnosis Date   Asthma      Past Surgical History:  Procedure Laterality Date   CESAREAN SECTION      Family History  Problem Relation Age of Onset   Bipolar disorder Mother    Diabetes Mother    Hypertension Mother    Heart failure Father     Social History   Tobacco Use   Smoking status: Never   Smokeless tobacco: Never  Vaping Use   Vaping Use: Never used  Substance Use Topics   Alcohol use: No    ROS   Objective:   Vitals: BP 107/67 (BP Location: Left Arm)   Pulse 83   Temp 98.2 F (36.8 C) (Oral)   Resp 18   SpO2 97%   Physical Exam Constitutional:      General: She is not in acute distress.    Appearance: Normal appearance. She is well-developed. She is not ill-appearing, toxic-appearing or diaphoretic.  HENT:     Head: Normocephalic and atraumatic.     Nose: Nose normal.     Mouth/Throat:     Mouth: Mucous  membranes are moist.  Eyes:     Extraocular Movements: Extraocular movements intact.     Pupils: Pupils are equal, round, and reactive to light.  Cardiovascular:     Rate and Rhythm: Normal rate and regular rhythm.     Pulses: Normal pulses.     Heart sounds: Normal heart sounds. No murmur heard.   No friction rub. No gallop.  Pulmonary:     Effort: Pulmonary effort is normal. No respiratory distress.     Breath sounds: Normal breath sounds. No stridor. No wheezing, rhonchi or rales.  Skin:    General: Skin is warm and dry.     Findings: No rash.  Neurological:     Mental Status: She is alert and oriented to person, place, and time.  Psychiatric:        Mood and Affect: Mood normal.        Behavior: Behavior normal.        Thought Content: Thought content normal.    Assessment and Plan :   PDMP not reviewed this encounter.  1. Viral URI with cough   2. Wheezing   3. Mild persistent asthma without complication     COVID-19 testing pending.  Will manage for viral respiratory illness likely worsened  by her asthma.  Patient requested a prednisone course that she has a really difficult time with her asthma she gets sick.  We will use a 5-day course.  Refilled her albuterol.  Recommend supportive care otherwise.  Deferred imaging given no clear cardiopulmonary exam, hemodynamically stable vital signs. Counseled patient on potential for adverse effects with medications prescribed/recommended today, ER and return-to-clinic precautions discussed, patient verbalized understanding.    Wallis Bamberg, PA-C 05/14/21 1450

## 2021-05-14 NOTE — ED Triage Notes (Signed)
Pt c/o asthma exacerbation that's worse at night. Cough, headache, SOB, onset last night. Tried mucinex and albuteral at home without resolution.

## 2021-05-14 NOTE — Discharge Instructions (Addendum)
We will notify you of your COVID-19 test results as they arrive and may take between 48-72 hours.  I encourage you to sign up for MyChart if you have not already done so as this can be the easiest way for us to communicate results to you online or through a phone app.  Generally, we only contact you if it is a positive COVID result.  In the meantime, if you develop worsening symptoms including fever, chest pain, shortness of breath despite our current treatment plan then please report to the emergency room as this may be a sign of worsening status from possible COVID-19 infection.  Otherwise, we will manage this as a viral syndrome. For sore throat or cough try using a honey-based tea. Use 3 teaspoons of honey with juice squeezed from half lemon. Place shaved pieces of ginger into 1/2-1 cup of water and warm over stove top. Then mix the ingredients and repeat every 4 hours as needed. Please take Tylenol 500mg-650mg every 6 hours for aches and pains, fevers. Hydrate very well with at least 2 liters of water. Eat light meals such as soups to replenish electrolytes and soft fruits, veggies. Start an antihistamine like Zyrtec, Allegra or Claritin for postnasal drainage, sinus congestion.    

## 2021-05-15 LAB — SARS-COV-2, NAA 2 DAY TAT

## 2021-05-15 LAB — NOVEL CORONAVIRUS, NAA: SARS-CoV-2, NAA: NOT DETECTED

## 2022-01-29 ENCOUNTER — Encounter: Payer: Self-pay | Admitting: Radiology

## 2022-10-26 NOTE — Progress Notes (Unsigned)
Patient ID: Nichole Morris, female    DOB: Oct 16, 1985  MRN: [id number]  CC: Annual Physical Exam   Subjective: Nichole Morris is a 37 y.o. female who presents for annual physical exam.   Her concerns today include:  Pap  Breast exam  Weight management    Patient Active Problem List   Diagnosis Date Noted   Degenerative disc disease at L5-S1 level 04/29/2021   Prediabetes 04/29/2021   Mild persistent asthma without complication [id number]   Difficulty sleeping 09/17/2019     Current Outpatient Medications on File Prior to Visit  Medication Sig Dispense Refill   albuterol (VENTOLIN HFA) 108 (90 Base) MCG/ACT inhaler Inhale 1-2 puffs into the lungs every 6 (six) hours as needed for wheezing or shortness of breath. 18 g 0   escitalopram (LEXAPRO) 10 MG tablet Take 1 tablet (10 mg total) by mouth daily. 30 tablet 0   ondansetron (ZOFRAN) 4 MG tablet Take 1 tablet (4 mg total) by mouth every 8 (eight) hours as needed for nausea or vomiting. 20 tablet 0   phentermine 15 MG capsule Take 1 capsule (15 mg total) by mouth every morning. 30 capsule 0   predniSONE (DELTASONE) 20 MG tablet Take 2 tablets daily with breakfast. 10 tablet 0   No current facility-administered medications on file prior to visit.    Allergies  Allergen Reactions   Shellfish Allergy Anaphylaxis, Shortness Of Breath and Swelling   Iodine Rash    Social History   Socioeconomic History   Marital status: Single    Spouse name: Not on file   Number of children: Not on file   Years of education: Not on file   Highest education level: Not on file  Occupational History   Not on file  Tobacco Use   Smoking status: Never   Smokeless tobacco: Never  Vaping Use   Vaping Use: Never used  Substance and Sexual Activity   Alcohol use: No   Drug use: Not on file   Sexual activity: Yes    Birth control/protection: Condom  Other Topics Concern   Not on file  Social History Narrative   Not on file    Social Determinants of Health   Financial Resource Strain: Not on file  Food Insecurity: Not on file  Transportation Needs: Not on file  Physical Activity: Not on file  Stress: Not on file  Social Connections: Not on file  Intimate Partner Violence: Not on file    Family History  Problem Relation Age of Onset   Bipolar disorder Mother    Diabetes Mother    Hypertension Mother    Heart failure Father     Past Surgical History:  Procedure Laterality Date   CESAREAN SECTION      ROS: Review of Systems Negative except as stated above  PHYSICAL EXAM: There were no vitals taken for this visit.  Physical Exam  {female adult master:310786} {female adult master:310785}     Latest Ref Rng & Units 04/28/2021   10:47 AM 01/17/2019   10:43 PM 08/13/2017    1:09 PM  CMP  Glucose 65 - 99 mg/dL 109  95  101   BUN 6 - 20 mg/dL 10  10  15    Creatinine 0.57 - 1.00 mg/dL 0.70  0.68  0.60   Sodium 134 - 144 mmol/L 139  137  135   Potassium 3.5 - 5.2 mmol/L 4.3  4.0  3.9   Chloride 96 - 106 mmol/L 104  104  105   CO2 20 - 29 mmol/L 21  22  23    Calcium 8.7 - 10.2 mg/dL 9.7  9.5  9.6   Total Protein 6.0 - 8.5 g/dL 7.3   7.5   Total Bilirubin 0.0 - 1.2 mg/dL 0.3   0.7   Alkaline Phos 44 - 121 IU/L 130   120   AST 0 - 40 IU/L 13   23   ALT 0 - 32 IU/L 11   22    Lipid Panel     Component Value Date/Time   CHOL 169 04/28/2021 1047   TRIG 117 04/28/2021 1047   HDL 39 (L) 04/28/2021 1047   CHOLHDL 4.3 04/28/2021 1047   LDLCALC 109 (H) 04/28/2021 1047    CBC    Component Value Date/Time   WBC 7.4 04/28/2021 1047   WBC 8.4 01/17/2019 2243   RBC 4.96 04/28/2021 1047   RBC 4.70 01/17/2019 2243   HGB 13.5 04/28/2021 1047   HCT 41.2 04/28/2021 1047   PLT 267 04/28/2021 1047   MCV 83 04/28/2021 1047   MCH 27.2 04/28/2021 1047   MCH 27.2 01/17/2019 2243   MCHC 32.8 04/28/2021 1047   MCHC 32.5 01/17/2019 2243   RDW 13.1 04/28/2021 1047   LYMPHSABS 1.8 08/13/2017 1309    MONOABS 0.5 08/13/2017 1309   EOSABS 0.2 08/13/2017 1309   BASOSABS 0.0 08/13/2017 1309    ASSESSMENT AND PLAN:  There are no diagnoses linked to this encounter.   Patient was given the opportunity to ask questions.  Patient verbalized understanding of the plan and was able to repeat key elements of the plan. Patient was given clear instructions to go to Emergency Department or return to medical center if symptoms don't improve, worsen, or new problems develop.The patient verbalized understanding.   No orders of the defined types were placed in this encounter.    Requested Prescriptions    No prescriptions requested or ordered in this encounter    No follow-ups on file.  Camillia Herter, NP

## 2022-10-28 ENCOUNTER — Ambulatory Visit (INDEPENDENT_AMBULATORY_CARE_PROVIDER_SITE_OTHER): Payer: BC Managed Care – PPO | Admitting: Family

## 2022-10-28 ENCOUNTER — Other Ambulatory Visit (HOSPITAL_COMMUNITY)
Admission: RE | Admit: 2022-10-28 | Discharge: 2022-10-28 | Disposition: A | Discharge status: Home / Self Care | Payer: BC Managed Care – PPO | Source: Ambulatory Visit | Attending: Family | Admitting: Family

## 2022-10-28 VITALS — BP 108/79 | HR 88 | Temp 98.3°F | Resp 16 | Ht 62.01 in | Wt 203.0 lb

## 2022-10-28 DIAGNOSIS — L918 Other hypertrophic disorders of the skin: Secondary | ICD-10-CM

## 2022-10-28 DIAGNOSIS — Z13228 Encounter for screening for other metabolic disorders: Secondary | ICD-10-CM | POA: Diagnosis not present

## 2022-10-28 DIAGNOSIS — Z124 Encounter for screening for malignant neoplasm of cervix: Secondary | ICD-10-CM | POA: Diagnosis not present

## 2022-10-28 DIAGNOSIS — Z7689 Persons encountering health services in other specified circumstances: Secondary | ICD-10-CM

## 2022-10-28 DIAGNOSIS — Z130 Encounter for screening for diseases of the blood and blood-forming organs and certain disorders involving the immune mechanism: Secondary | ICD-10-CM

## 2022-10-28 DIAGNOSIS — Z113 Encounter for screening for infections with a predominantly sexual mode of transmission: Secondary | ICD-10-CM | POA: Diagnosis not present

## 2022-10-28 DIAGNOSIS — Z1231 Encounter for screening mammogram for malignant neoplasm of breast: Secondary | ICD-10-CM

## 2022-10-28 DIAGNOSIS — G5601 Carpal tunnel syndrome, right upper limb: Secondary | ICD-10-CM

## 2022-10-28 DIAGNOSIS — Z8249 Family history of ischemic heart disease and other diseases of the circulatory system: Secondary | ICD-10-CM | POA: Diagnosis not present

## 2022-10-28 DIAGNOSIS — N644 Mastodynia: Secondary | ICD-10-CM

## 2022-10-28 DIAGNOSIS — R7303 Prediabetes: Secondary | ICD-10-CM

## 2022-10-28 DIAGNOSIS — E669 Obesity, unspecified: Secondary | ICD-10-CM

## 2022-10-28 DIAGNOSIS — Z13220 Encounter for screening for lipoid disorders: Secondary | ICD-10-CM | POA: Diagnosis not present

## 2022-10-28 DIAGNOSIS — R002 Palpitations: Secondary | ICD-10-CM

## 2022-10-28 DIAGNOSIS — Z0001 Encounter for general adult medical examination with abnormal findings: Secondary | ICD-10-CM | POA: Diagnosis not present

## 2022-10-28 DIAGNOSIS — L731 Pseudofolliculitis barbae: Secondary | ICD-10-CM

## 2022-10-28 DIAGNOSIS — R635 Abnormal weight gain: Secondary | ICD-10-CM

## 2022-10-28 DIAGNOSIS — R000 Tachycardia, unspecified: Secondary | ICD-10-CM | POA: Diagnosis not present

## 2022-10-28 DIAGNOSIS — Z0000 Encounter for general adult medical examination without abnormal findings: Secondary | ICD-10-CM

## 2022-10-28 DIAGNOSIS — Z1329 Encounter for screening for other suspected endocrine disorder: Secondary | ICD-10-CM

## 2022-10-28 DIAGNOSIS — Z6837 Body mass index (BMI) 37.0-37.9, adult: Secondary | ICD-10-CM

## 2022-10-28 MED ORDER — DULOXETINE HCL 20 MG PO CPEP
20.0000 mg | ORAL_CAPSULE | Freq: Every day | ORAL | 0 refills | Status: AC
Start: 2022-10-28 — End: 2022-11-27

## 2022-10-28 NOTE — Patient Instructions (Signed)

## 2022-10-28 NOTE — Progress Notes (Signed)
.  Pt presents for annual physical exam  -desires breast exam

## 2022-10-29 ENCOUNTER — Encounter: Payer: Self-pay | Admitting: Family

## 2022-10-29 ENCOUNTER — Other Ambulatory Visit: Payer: Self-pay | Admitting: Family

## 2022-10-29 DIAGNOSIS — N644 Mastodynia: Secondary | ICD-10-CM

## 2022-10-29 DIAGNOSIS — Z1231 Encounter for screening mammogram for malignant neoplasm of breast: Secondary | ICD-10-CM

## 2022-10-29 LAB — CERVICOVAGINAL ANCILLARY ONLY
Bacterial Vaginitis (gardnerella): NEGATIVE
Candida Glabrata: NEGATIVE
Candida Vaginitis: NEGATIVE
Chlamydia: NEGATIVE
Comment: NEGATIVE
Comment: NEGATIVE
Comment: NEGATIVE
Comment: NEGATIVE
Comment: NEGATIVE
Comment: NORMAL
Neisseria Gonorrhea: NEGATIVE
Trichomonas: NEGATIVE

## 2022-10-29 LAB — CBC
Hematocrit: 39.9 % (ref 34.0–46.6)
Hemoglobin: 13.4 g/dL (ref 11.1–15.9)
MCH: 27.3 pg (ref 26.6–33.0)
MCHC: 33.6 g/dL (ref 31.5–35.7)
MCV: 81 fL (ref 79–97)
Platelets: 263 10*3/uL (ref 150–450)
RBC: 4.9 x10E6/uL (ref 3.77–5.28)
RDW: 13.5 % (ref 11.7–15.4)
WBC: 6.3 10*3/uL (ref 3.4–10.8)

## 2022-10-29 LAB — HEMOGLOBIN A1C
Est. average glucose Bld gHb Est-mCnc: 134 mg/dL
Hgb A1c MFr Bld: 6.3 % — ABNORMAL HIGH (ref 4.8–5.6)

## 2022-10-29 LAB — CMP14+EGFR
ALT: 14 IU/L (ref 0–32)
AST: 18 IU/L (ref 0–40)
Albumin/Globulin Ratio: 1.8 (ref 1.2–2.2)
Albumin: 4.6 g/dL (ref 3.9–4.9)
Alkaline Phosphatase: 130 IU/L — ABNORMAL HIGH (ref 44–121)
BUN/Creatinine Ratio: 14 (ref 9–23)
BUN: 10 mg/dL (ref 6–20)
Bilirubin Total: 0.2 mg/dL (ref 0.0–1.2)
CO2: 22 mmol/L (ref 20–29)
Calcium: 9.6 mg/dL (ref 8.7–10.2)
Chloride: 106 mmol/L (ref 96–106)
Creatinine, Ser: 0.7 mg/dL (ref 0.57–1.00)
Globulin, Total: 2.6 g/dL (ref 1.5–4.5)
Glucose: 106 mg/dL — ABNORMAL HIGH (ref 70–99)
Potassium: 4.2 mmol/L (ref 3.5–5.2)
Sodium: 142 mmol/L (ref 134–144)
Total Protein: 7.2 g/dL (ref 6.0–8.5)
eGFR: 114 mL/min/{1.73_m2} (ref >59)

## 2022-10-29 LAB — LIPID PANEL
Chol/HDL Ratio: 4.2 ratio (ref 0.0–4.4)
Cholesterol, Total: 174 mg/dL (ref 100–199)
HDL: 41 mg/dL (ref >39)
LDL Chol Calc (NIH): 104 mg/dL — ABNORMAL HIGH (ref 0–99)
Triglycerides: 166 mg/dL — ABNORMAL HIGH (ref 0–149)
VLDL Cholesterol Cal: 29 mg/dL (ref 5–40)

## 2022-10-29 LAB — TSH: TSH: 2.31 u[IU]/mL (ref 0.450–4.500)

## 2022-10-30 LAB — CYTOLOGY - PAP
Comment: NEGATIVE
Diagnosis: NEGATIVE
High risk HPV: NEGATIVE

## 2022-11-06 ENCOUNTER — Encounter: Payer: Self-pay | Admitting: General Practice

## 2022-12-17 ENCOUNTER — Other Ambulatory Visit: Payer: BC Managed Care – PPO

## 2023-01-06 ENCOUNTER — Ambulatory Visit
Admission: RE | Admit: 2023-01-06 | Discharge: 2023-01-06 | Disposition: A | Discharge status: Home / Self Care | Payer: BC Managed Care – PPO | Source: Ambulatory Visit | Attending: Family | Admitting: Family

## 2023-01-06 ENCOUNTER — Ambulatory Visit: Payer: BC Managed Care – PPO

## 2023-01-06 DIAGNOSIS — N644 Mastodynia: Secondary | ICD-10-CM | POA: Diagnosis not present

## 2023-01-06 DIAGNOSIS — Z1231 Encounter for screening mammogram for malignant neoplasm of breast: Secondary | ICD-10-CM

## 2023-02-04 ENCOUNTER — Ambulatory Visit (INDEPENDENT_AMBULATORY_CARE_PROVIDER_SITE_OTHER): Payer: BC Managed Care – PPO | Admitting: Family

## 2023-02-04 ENCOUNTER — Encounter: Payer: Self-pay | Admitting: Family

## 2023-02-04 VITALS — BP 108/75 | HR 83 | Ht 61.0 in | Wt 209.2 lb

## 2023-02-04 DIAGNOSIS — Z3202 Encounter for pregnancy test, result negative: Secondary | ICD-10-CM | POA: Diagnosis not present

## 2023-02-04 DIAGNOSIS — Z30013 Encounter for initial prescription of injectable contraceptive: Secondary | ICD-10-CM

## 2023-02-04 LAB — POCT URINE PREGNANCY: Preg Test, Ur: NEGATIVE

## 2023-02-04 MED ORDER — MEDROXYPROGESTERONE ACETATE 150 MG/ML IM SUSP: 150.0000 mg | Freq: Once | INTRAMUSCULAR | 0 refills | Status: AC

## 2023-02-04 MED ORDER — MEDROXYPROGESTERONE ACETATE 150 MG/ML IM SUSY
150.0000 mg | PREFILLED_SYRINGE | Freq: Once | INTRAMUSCULAR | Status: AC
  Administered 2023-02-04: 150 mg via INTRAMUSCULAR

## 2023-02-04 NOTE — Progress Notes (Signed)
Patient ID: Nichole Morris, female    DOB: 06-18-1986  MRN: [id number]  CC: Contraception  Subjective: Nichole Morris is a 37 y.o. female who presents for contraception.   Her concerns today include:  Would like to begin Depo-Provera injection. No further issues/concerns for discussion today.   Patient Active Problem List   Diagnosis Date Noted   Degenerative disc disease at L5-S1 level 04/29/2021   Prediabetes 04/29/2021   Mild persistent asthma without complication 09/17/2019   Difficulty sleeping 09/17/2019     Current Outpatient Medications on File Prior to Visit  Medication Sig Dispense Refill   albuterol (VENTOLIN HFA) 108 (90 Base) MCG/ACT inhaler Inhale 1-2 puffs into the lungs every 6 (six) hours as needed for wheezing or shortness of breath. 18 g 0   ondansetron (ZOFRAN) 4 MG tablet Take 1 tablet (4 mg total) by mouth every 8 (eight) hours as needed for nausea or vomiting. 20 tablet 0   predniSONE (DELTASONE) 20 MG tablet Take 2 tablets daily with breakfast. 10 tablet 0   DULoxetine (CYMBALTA) 20 MG capsule Take 1 capsule (20 mg total) by mouth daily. 30 capsule 0   escitalopram (LEXAPRO) 10 MG tablet Take 1 tablet (10 mg total) by mouth daily. 30 tablet 0   phentermine 15 MG capsule Take 1 capsule (15 mg total) by mouth every morning. 30 capsule 0   No current facility-administered medications on file prior to visit.    Allergies  Allergen Reactions   Shellfish Allergy Anaphylaxis, Shortness Of Breath and Swelling   Iodine Rash    Social History   Socioeconomic History   Marital status: Single    Spouse name: Not on file   Number of children: Not on file   Years of education: Not on file   Highest education level: Not on file  Occupational History   Not on file  Tobacco Use   Smoking status: Never   Smokeless tobacco: Never  Vaping Use   Vaping Use: Never used  Substance and Sexual Activity   Alcohol use: No   Drug use: Not on file    Sexual activity: Yes    Birth control/protection: Condom  Other Topics Concern   Not on file  Social History Narrative   Not on file   Social Determinants of Health   Financial Resource Strain: Not on file  Food Insecurity: Not on file  Transportation Needs: Not on file  Physical Activity: Not on file  Stress: Not on file  Social Connections: Not on file  Intimate Partner Violence: Not on file    Family History  Problem Relation Age of Onset   Bipolar disorder Mother    Diabetes Mother    Hypertension Mother    Heart failure Father     Past Surgical History:  Procedure Laterality Date   CESAREAN SECTION      ROS: Review of Systems Negative except as stated above  PHYSICAL EXAM: BP 108/75   Pulse 83   Ht 5\' 1"  (1.549 m)   Wt 209 lb 3.2 oz (94.9 kg)   SpO2 97%   BMI 39.53 kg/m   Physical Exam HENT:     Head: Normocephalic and atraumatic.  Eyes:     Extraocular Movements: Extraocular movements intact.     Conjunctiva/sclera: Conjunctivae normal.     Pupils: Pupils are equal, round, and reactive to light.  Cardiovascular:     Rate and Rhythm: Normal rate and regular rhythm.     Pulses: Normal  pulses.     Heart sounds: Normal heart sounds.  Pulmonary:     Effort: Pulmonary effort is normal.     Breath sounds: Normal breath sounds.  Musculoskeletal:     Cervical back: Normal range of motion and neck supple.  Neurological:     General: No focal deficit present.     Mental Status: She is alert and oriented to person, place, and time.  Psychiatric:        Mood and Affect: Mood normal.        Behavior: Behavior normal.     ASSESSMENT AND PLAN: 1. Encounter for initial prescription of injectable contraceptive - Depo-Provera administered in office. Counseled on medication adherence/adverse effects. - Routine screening.  - Follow-up with primary provider in 3 months or sooner if needed.  - POCT urine pregnancy; Future - medroxyPROGESTERone (DEPO-PROVERA)  150 MG/ML injection; Inject 1 mL (150 mg total) into the muscle once for 1 dose.  Dispense: 1 mL; Refill: 0   Patient was given the opportunity to ask questions.  Patient verbalized understanding of the plan and was able to repeat key elements of the plan. Patient was given clear instructions to go to Emergency Department or return to medical center if symptoms don't improve, worsen, or new problems develop.The patient verbalized understanding.   Orders Placed This Encounter  Procedures   POCT urine pregnancy     Requested Prescriptions   Signed Prescriptions Disp Refills   medroxyPROGESTERone (DEPO-PROVERA) 150 MG/ML injection 1 mL 0    Sig: Inject 1 mL (150 mg total) into the muscle once for 1 dose.    Return in about 3 months (around 05/07/2023) for Follow-Up or next available contraception mgmt .  Nichole Fendt, NP

## 2023-02-04 NOTE — Progress Notes (Signed)
Pt wants to know about options for birth control and if she can get on it today.

## 2023-02-04 NOTE — Patient Instructions (Addendum)

## 2023-02-04 NOTE — Addendum Note (Signed)
Addended by: Marcelle Overlie on: 02/04/2023 11:25 AM   Modules accepted: Orders

## 2023-06-27 IMAGING — DX DG LUMBAR SPINE COMPLETE 4+V
5 series · 5 of 5 positions shown · non-contrast
Comparison: None.

CLINICAL DATA: Chronic low back pain.

EXAM:
LUMBAR SPINE - COMPLETE 4+ VIEW

[lumbar spine ap]
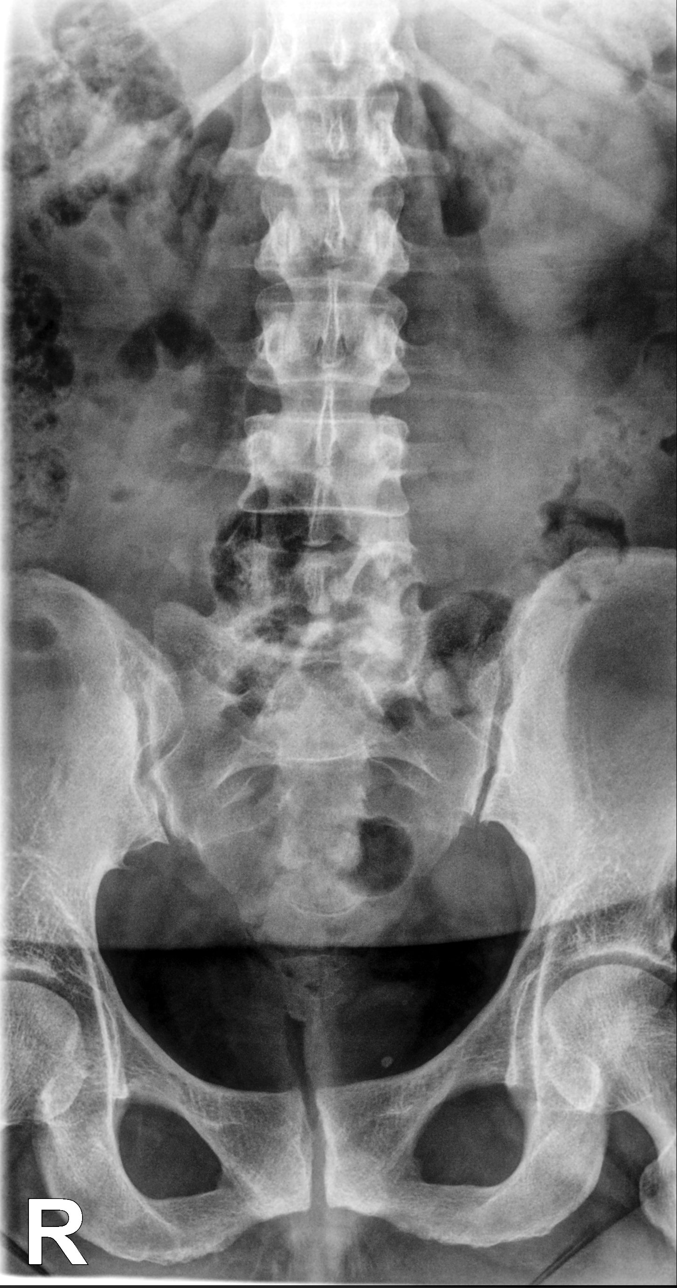

[lumbar spine lat]
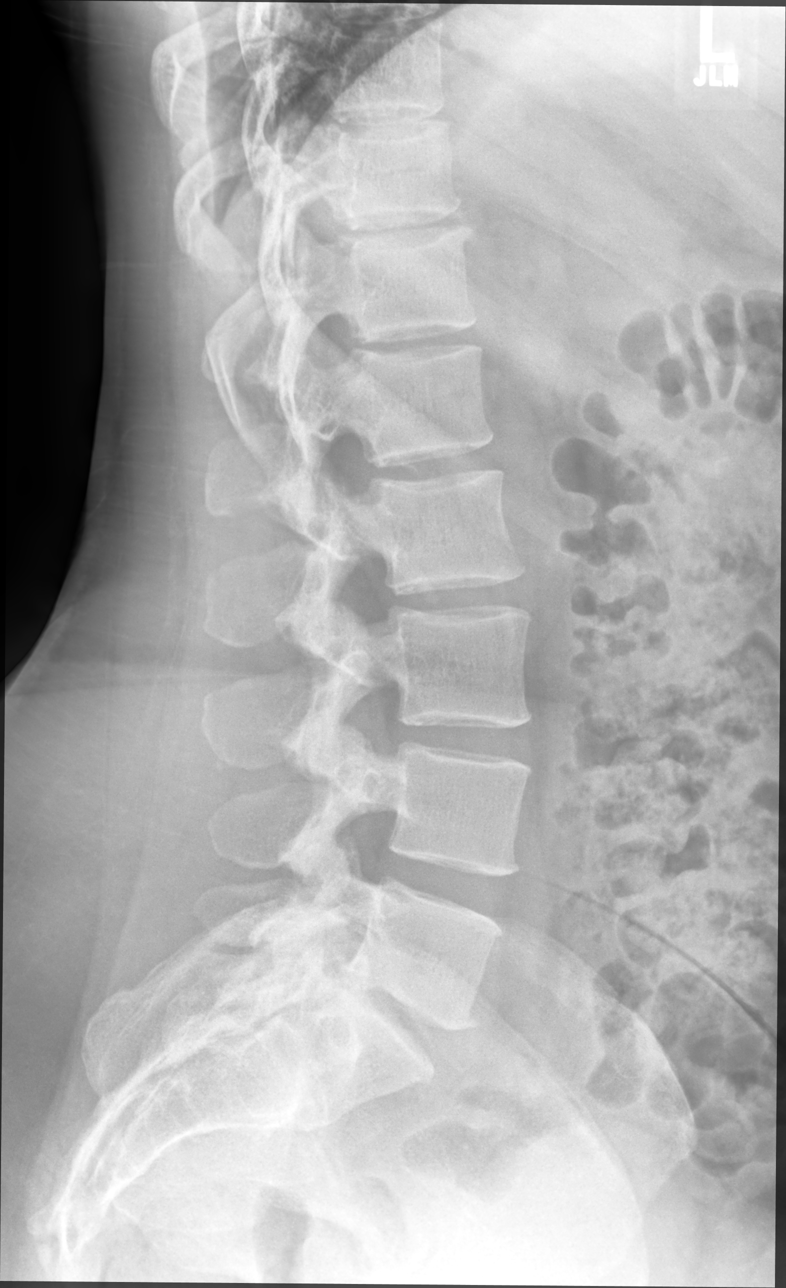

[lumbar spine oblique supine (1 of 2)]
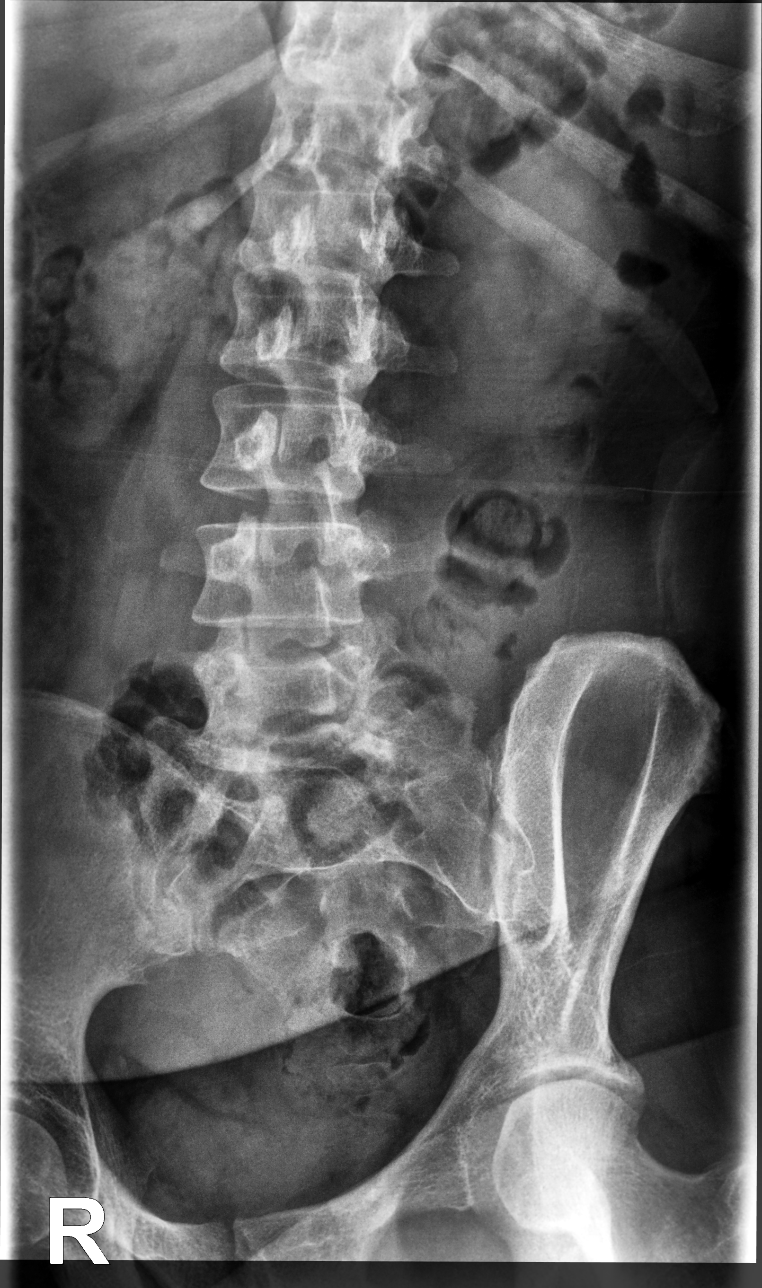

[lumbar spine oblique supine (2 of 2)]
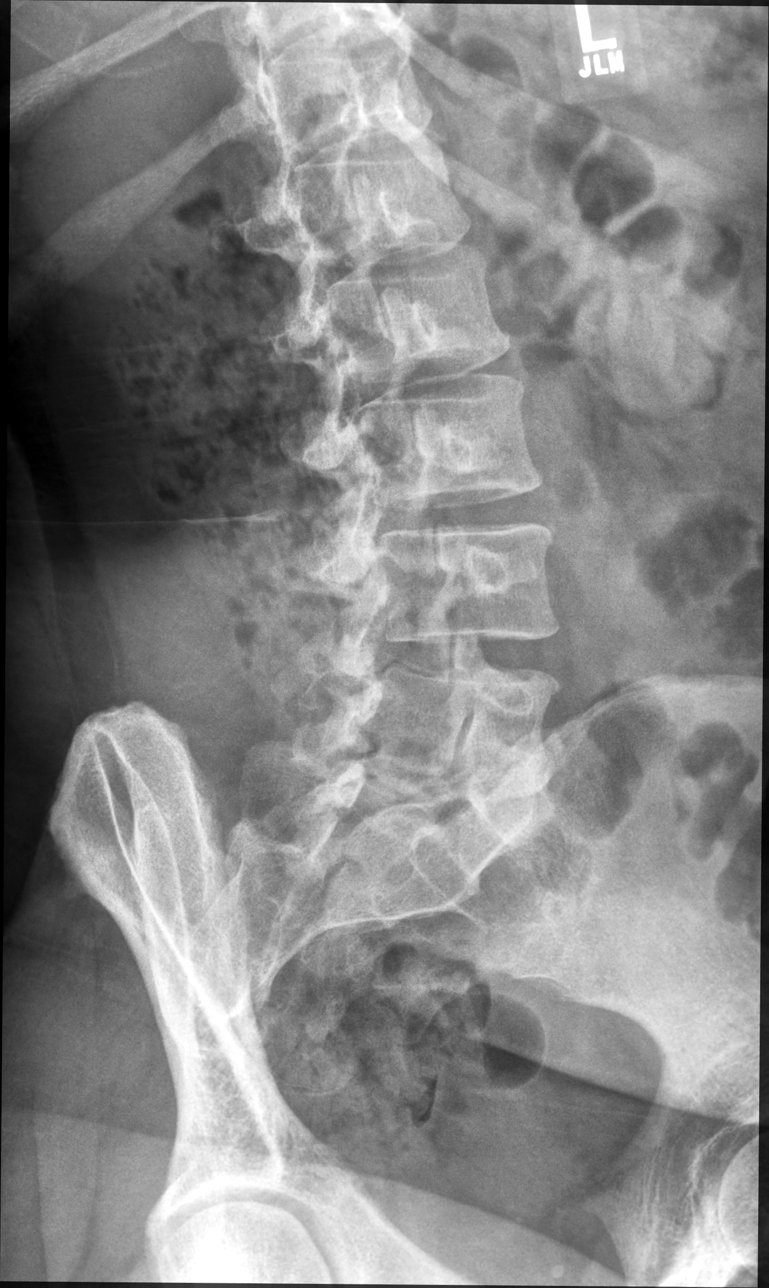

[lumbar spine lateral position lat]
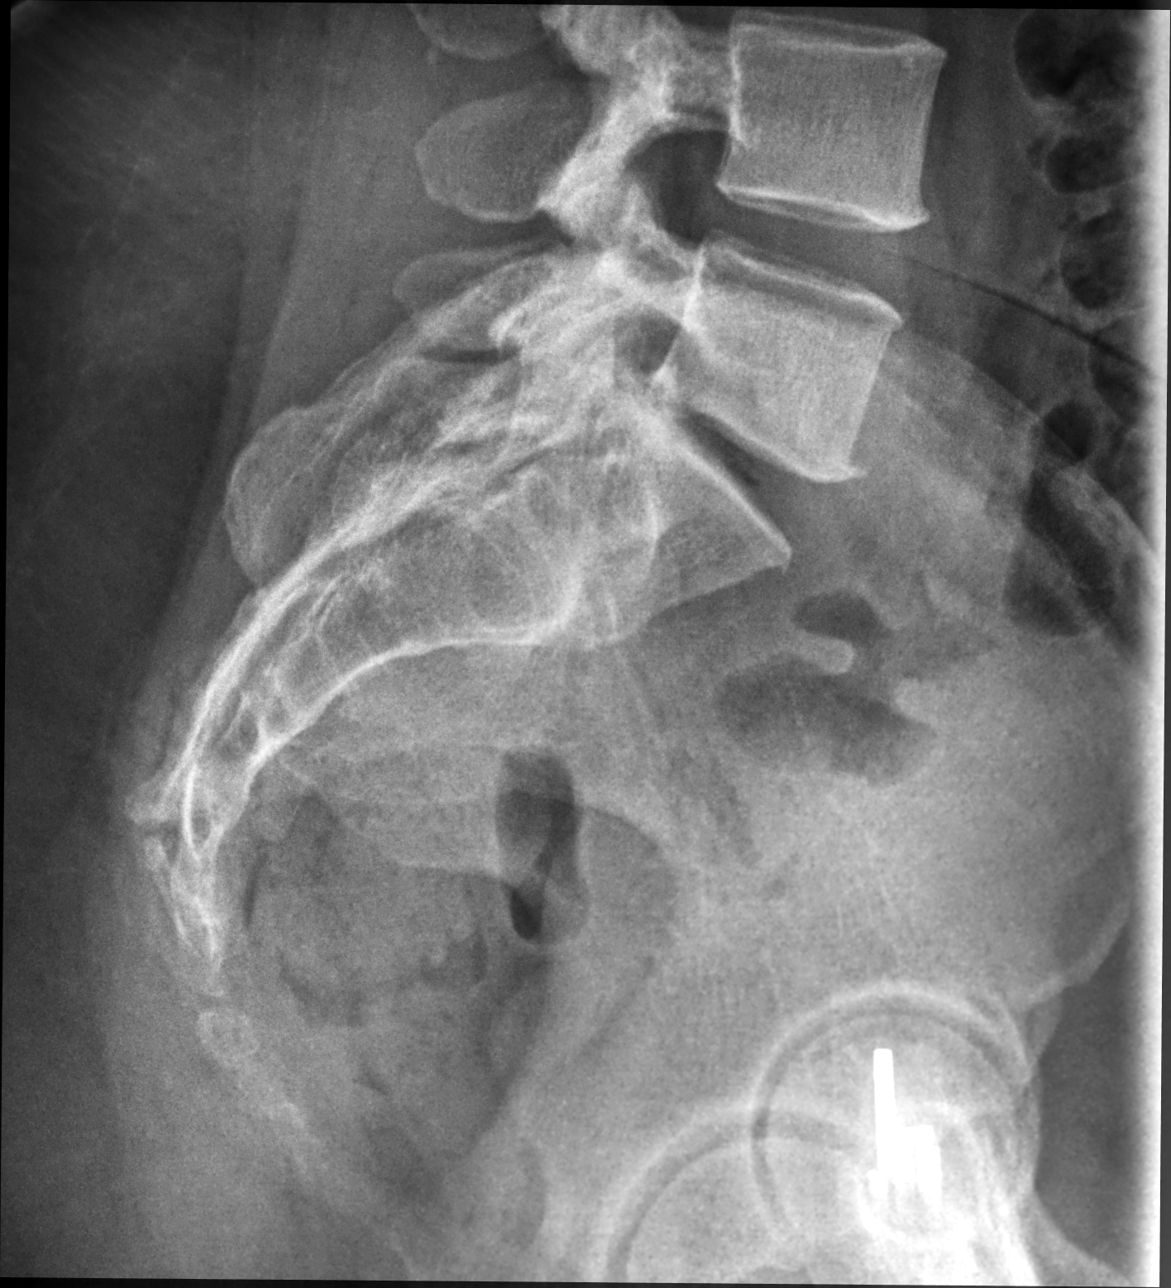

[5 of 5 positions shown; findings below may reference images not displayed]

FINDINGS: There is no evidence of lumbar spine fracture. Alignment is normal.
There is mild disc space narrowing at L5-S1 with degenerative
osteophytes along the endplates. The soft tissues are within normal
limits.
IMPRESSION: 1. No acute bony abnormality.
2. Mild degenerative changes at L5-S1.

## 2024-04-24 DIAGNOSIS — Z20822 Contact with and (suspected) exposure to covid-19: Secondary | ICD-10-CM | POA: Diagnosis not present

## 2024-04-24 DIAGNOSIS — R0789 Other chest pain: Secondary | ICD-10-CM | POA: Diagnosis not present

## 2024-04-24 DIAGNOSIS — R200 Anesthesia of skin: Secondary | ICD-10-CM | POA: Diagnosis not present

## 2024-04-24 DIAGNOSIS — R202 Paresthesia of skin: Secondary | ICD-10-CM | POA: Diagnosis not present

## 2024-04-24 DIAGNOSIS — R079 Chest pain, unspecified: Secondary | ICD-10-CM | POA: Diagnosis not present

## 2024-04-24 DIAGNOSIS — M79602 Pain in left arm: Secondary | ICD-10-CM | POA: Diagnosis not present

## 2024-04-24 DIAGNOSIS — Z791 Long term (current) use of non-steroidal anti-inflammatories (NSAID): Secondary | ICD-10-CM | POA: Diagnosis not present

## 2024-04-24 DIAGNOSIS — R6883 Chills (without fever): Secondary | ICD-10-CM | POA: Diagnosis not present

## 2024-04-25 DIAGNOSIS — R079 Chest pain, unspecified: Secondary | ICD-10-CM | POA: Diagnosis not present
